# Patient Record
Sex: Female | Born: 1980 | Hispanic: Yes | Marital: Single | Smoking: Never smoker
Health system: Southern US, Community
[De-identification: ages and names within clinical notes are randomized; demographics above are authoritative.]

## PROBLEM LIST (undated history)

## (undated) ENCOUNTER — Inpatient Hospital Stay (HOSPITAL_COMMUNITY): Payer: Self-pay

## (undated) DIAGNOSIS — Z833 Family history of diabetes mellitus: Secondary | ICD-10-CM

## (undated) DIAGNOSIS — Z789 Other specified health status: Secondary | ICD-10-CM

## (undated) DIAGNOSIS — R0789 Other chest pain: Secondary | ICD-10-CM

## (undated) DIAGNOSIS — R079 Chest pain, unspecified: Secondary | ICD-10-CM

## (undated) HISTORY — DX: Other chest pain: R07.89

## (undated) HISTORY — DX: Chest pain, unspecified: R07.9

## (undated) HISTORY — DX: Family history of diabetes mellitus: Z83.3

---

## 2014-11-01 ENCOUNTER — Ambulatory Visit (INDEPENDENT_AMBULATORY_CARE_PROVIDER_SITE_OTHER): Payer: Self-pay | Admitting: Internal Medicine

## 2014-11-01 VITALS — BP 118/80 | HR 82 | Temp 98.1°F | Resp 18 | Ht 63.5 in | Wt 135.2 lb

## 2014-11-01 DIAGNOSIS — Z3490 Encounter for supervision of normal pregnancy, unspecified, unspecified trimester: Secondary | ICD-10-CM

## 2014-11-01 DIAGNOSIS — M542 Cervicalgia: Secondary | ICD-10-CM

## 2014-11-01 DIAGNOSIS — S161XXA Strain of muscle, fascia and tendon at neck level, initial encounter: Secondary | ICD-10-CM

## 2014-11-01 NOTE — Progress Notes (Signed)
   Subjective:    Patient ID: Melanie Haynes, female    DOB: 24-May-1981, 33 y.o.   MRN: [id number]  HPI 33 year old Hispanic Pregnant female is here today with complaints of neck pain that is radiating to the front of her chest for the past weeks. She states sha hast not done anything out of ordinary routine. She states that she does have a history of muscle spasms but can not remember the medication that she was previously prescribed. She states that she does pick up her 561 year old son a lot. History of a previous fall in 2002 while at work. She states she was carrying a tray and slipped the kitchen. She states that she was advised of a bruised hip that would resolve on its own. Patient confirmed pregnancy on Monday, 10/28/2014. She has a OBGYN appointment scheduled for December 3 rd [facility]. She states she also has a 32 year old daughter at home. She states she had a miscarriage on 07/30/2014, but had no complications from the miscarriage. She has had two cesareans stating that the only complications were bleeding in the first month of the 1st trimester.She states she is otherwise a healthy individual.      Review of Systems     Objective:   Physical Exam  Constitutional: She is oriented to person, place, and time. She appears well-developed and well-nourished. She appears distressed.  HENT:  Head: Normocephalic.  Nose: Nose normal.  Eyes: Conjunctivae and EOM are normal. Pupils are equal, round, and reactive to light.  Cardiovascular: Normal rate.   Pulmonary/Chest: Effort normal.  Musculoskeletal:       Cervical back: She exhibits decreased range of motion, tenderness, pain and spasm. She exhibits no bony tenderness, no swelling, no edema, no deformity and normal pulse.  Lymphadenopathy:    She has no cervical adenopathy.  Neurological: She is alert and oriented to person, place, and time. She has normal reflexes. No cranial nerve deficit. She exhibits normal muscle  tone. Coordination normal.  Psychiatric: She has a normal mood and affect. Her behavior is normal.  Vitals reviewed.         Assessment & Plan:  Neck strain Pregnancy

## 2014-11-01 NOTE — Patient Instructions (Signed)
Medicines During Pregnancy During pregnancy, there are medicines that are either safe or unsafe to take. Medicines include prescriptions from your caregiver, over-the-counter medicines, topical creams applied to the skin, and all herbal substances. Medicines are put into either Class A, B, C, or D. Class A and B medicines have been shown to be safe in pregnancy. Class C medicines are also considered to be safe in pregnancy, but these medicines should only be used when necessary. Class D medicines should not be used at all in pregnancy. They can be harmful to a baby.  It is best to take as little medicine as possible while pregnant. However, some medicines are necessary to take for the mother and baby's health. Sometimes, it is more dangerous to stop taking certain medicines than to stay on them. This is often the case for people with long-term (chronic) conditions such as asthma, diabetes, or high blood pressure (hypertension). If you are pregnant and have a chronic illness, call your caregiver right away. Bring a list of your medicines and their doses to your appointments. If you are planning to become pregnant, schedule a doctor's appointment and discuss your medicines with your caregiver. Lastly, write down the phone number to your pharmacist. They can answer questions regarding a medicine's class and safety. They cannot give advice as to whether you should or should not be on a medicine.  SAFE AND UNSAFE MEDICINES There is a long list of medicines that are considered safe in pregnancy. Below is a shorter list. For specific medicines, ask your caregiver.  AllergyMedicines Loratadine, cetirizine, and chlorpheniramine are safe to take. Certain nasal steroid sprays are safe. Talk to your caregiver about specific brands that are safe. Analgesics Acetaminophen and acetaminophen with codeine are safe to take. All other nonsteroidal anti-inflammatory drugs (NSAIDS) are not safe. This includes ibuprofen.   Antacids Many over-the-counter antacids are safe to take. Talk to your caregiver about specific brands that are safe. Famotidine, ranitidine, and lansoprazole are safe. Omepresole is considered safe to take in the second trimester. Antibiotic Medicines There are several antibiotics to avoid. These include, but are not limited to, tetracyline, quinolones, and sulfa medications. Talk to your caregiver before taking any antibiotic.  Antihistamines Talk to your caregiver about specific brands that are safe.  Asthma Medicines Most asthma steroid inhalers are safe to take. Talk to your caregiver for specific details. Calcium Calcium supplements are safe to take. Do not take oyster shell calcium.  Cough and Cold Medicines It is safe to take products with guaifenesin or dextromethorphan. Talk to your caregiver about specific brands that are safe. It is not safe to take products that contain aspirin or ibuprofen. Decongestant Medicines Pseudoephedrine-containing products are safe to take in the second and third trimester.  Depression Medicines Talk about these medicines with your caregiver.  Antidiarrheal Medicines It is safe to take loperamide. Talk to your caregiver about specific brands that are safe. It is not safe to take any antidiarrheal medicine that contains bismuth. Eyedrops Allergy eyedrops should be limited.  Iron It is safe to use certain iron-containing medicines for anemia in pregnancy. They require a prescription.  Antinausea Medicines It is safe to take doxylamine and vitamin B6 as directed. There are other prescription medicines available, if needed.  Sleep aids It is safe to take diphenhydramine and acetaminophen with diphenhydramine.  Steroids Hydrocortisone creams are safe to use as directed. Oral steroids require a prescription. It is not safe to take any hemorrhoid cream with pramoxine or phenylephrine. Stool  softener It is safe to take stool softener  medicines. Avoid daily or prolonged use of stool softeners. Thyroid Medicine It is important to stay on this thyroid medicine. It needs to be followed by your caregiver.  Vaginal Medicines Your caregiver will prescribe a medicine to you if you have a vaginal infection. Certain antifungal medicines are safe to use if you have a sexually transmitted infection (STI). Talk to your caregiver.  Document Released: 11/29/2005 Document Revised: 02/21/2012 Document Reviewed: 11/30/2011 Exit[employer] Patient Information 2015 [employer]. This information is not intended to replace advice given to you by your health care provider. Make sure you discuss any questions you have with your health care provider.  

## 2014-11-09 ENCOUNTER — Encounter (HOSPITAL_COMMUNITY): Payer: Self-pay | Admitting: *Deleted

## 2014-11-09 ENCOUNTER — Emergency Department (HOSPITAL_COMMUNITY): Payer: Medicaid Other

## 2014-11-09 ENCOUNTER — Emergency Department (HOSPITAL_COMMUNITY)
Admission: EM | Admit: 2014-11-09 | Discharge: 2014-11-10 | Disposition: A | Discharge status: Home / Self Care | Payer: Medicaid Other | Attending: Emergency Medicine | Admitting: Emergency Medicine

## 2014-11-09 DIAGNOSIS — O209 Hemorrhage in early pregnancy, unspecified: Secondary | ICD-10-CM | POA: Diagnosis present

## 2014-11-09 DIAGNOSIS — Z3A01 Less than 8 weeks gestation of pregnancy: Secondary | ICD-10-CM | POA: Diagnosis not present

## 2014-11-09 DIAGNOSIS — Z9889 Other specified postprocedural states: Secondary | ICD-10-CM | POA: Diagnosis not present

## 2014-11-09 DIAGNOSIS — O200 Threatened abortion: Secondary | ICD-10-CM | POA: Diagnosis not present

## 2014-11-09 DIAGNOSIS — N939 Abnormal uterine and vaginal bleeding, unspecified: Secondary | ICD-10-CM

## 2014-11-09 LAB — ABO/RH: ABO/RH(D): O POS

## 2014-11-09 LAB — CBC
HEMATOCRIT: 36.1 % (ref 36.0–46.0)
Hemoglobin: 12.1 g/dL (ref 12.0–15.0)
MCH: 27.9 pg (ref 26.0–34.0)
MCHC: 33.5 g/dL (ref 30.0–36.0)
MCV: 83.2 fL (ref 78.0–100.0)
Platelets: 263 10*3/uL (ref 150–400)
RBC: 4.34 MIL/uL (ref 3.87–5.11)
RDW: 14.9 % (ref 11.5–15.5)
WBC: 9.1 10*3/uL (ref 4.0–10.5)

## 2014-11-09 LAB — COMPREHENSIVE METABOLIC PANEL
ALT: 10 U/L (ref 0–35)
ANION GAP: 13 (ref 5–15)
AST: 14 U/L (ref 0–37)
Albumin: 3.7 g/dL (ref 3.5–5.2)
Alkaline Phosphatase: 44 U/L (ref 39–117)
BUN: 11 mg/dL (ref 6–23)
CHLORIDE: 102 meq/L (ref 96–112)
CO2: 22 meq/L (ref 19–32)
CREATININE: 0.58 mg/dL (ref 0.50–1.10)
Calcium: 9.7 mg/dL (ref 8.4–10.5)
GFR calc Af Amer: >90 mL/min (ref >90)
Glucose, Bld: 91 mg/dL (ref 70–99)
Potassium: 4.3 mEq/L (ref 3.7–5.3)
Sodium: 137 mEq/L (ref 137–147)
Total Protein: 7.1 g/dL (ref 6.0–8.3)

## 2014-11-09 LAB — PREGNANCY, URINE: Preg Test, Ur: POSITIVE — AB

## 2014-11-09 LAB — HCG, QUANTITATIVE, PREGNANCY: hCG, Beta Chain, Quant, S: 23613 m[IU]/mL — ABNORMAL HIGH (ref <5)

## 2014-11-09 MED ORDER — PRENATAL MULTIVITAMIN CH: 1.0000 | ORAL_TABLET | Freq: Every day | ORAL | Status: AC

## 2014-11-09 NOTE — ED Notes (Signed)
Pt in c/o lower back pain since yesterday and today noted vaginal bleeding that she describes as spotting, pt reports that she is approx [redacted] weeks pregnant, has not had an ultrasound completed, no distress noted

## 2014-11-09 NOTE — ED Notes (Signed)
Denies N/V/D

## 2014-11-09 NOTE — ED Provider Notes (Signed)
CSN: [id number]     Arrival date & time 11/09/14  1802 History   First MD Initiated Contact with Patient 11/09/14 2226     Chief Complaint  Patient presents with  . Vaginal Bleeding  . Pregnant      (Consider location/radiation/quality/duration/timing/severity/associated sxs/prior Treatment) Patient is a 33 y.o. female presenting with vaginal bleeding. The history is provided by the patient.  Vaginal Bleeding Quality:  Bright red Severity:  Mild Onset quality:  Gradual Duration:  1 day Timing:  Constant Progression:  Worsening Chronicity:  New Possible pregnancy: yes   Context: not after intercourse and not during urination   Relieved by:  None tried Worsened by:  Nothing tried Ineffective treatments:  None tried Associated symptoms: abdominal pain and back pain   Associated symptoms: no dizziness, no dysuria and no fatigue   Risk factors: prior miscarriage   Risk factors: no hx of ectopic pregnancy and no gynecological surgery     History reviewed. No pertinent past medical history. Past Surgical History  Procedure Laterality Date  . Cesarean section     History reviewed. No pertinent family history. History  Substance Use Topics  . Smoking status: Never Smoker   . Smokeless tobacco: Not on file  . Alcohol Use: No   OB History    No data available     Review of Systems  Constitutional: Negative for fatigue.  Gastrointestinal: Positive for abdominal pain.  Genitourinary: Positive for vaginal bleeding. Negative for dysuria.  Musculoskeletal: Positive for back pain.  Neurological: Negative for dizziness and syncope.  All other systems reviewed and are negative.     Allergies  Review of patient's allergies indicates no known allergies.  Home Medications   Prior to Admission medications   Medication Sig Start Date End Date Taking? Authorizing Provider  Prenatal Vit-Fe Fumarate-FA (PRENATAL MULTIVITAMIN) TABS tablet Take 1 tablet by mouth daily at 12 noon.  11/09/14   Marily MemosJason Beva Remund, MD   BP 117/75 mmHg  Pulse 96  Temp(Src) 98.2 F (36.8 C) (Oral)  Resp 16  Ht 5\' 3"  (1.6 m)  Wt 135 lb 9.6 oz (61.508 kg)  BMI 24.03 kg/m2  SpO2 100%  LMP 09/30/2014 Physical Exam  Constitutional: She is oriented to person, place, and time. She appears well-developed and well-nourished.  HENT:  Head: Normocephalic and atraumatic.  Eyes: Conjunctivae and EOM are normal. Pupils are equal, round, and reactive to light.  Neck: Normal range of motion.  Cardiovascular: Normal rate and regular rhythm.   Pulmonary/Chest: Effort normal and breath sounds normal.  Abdominal: Soft. Bowel sounds are normal.  Musculoskeletal: Normal range of motion. She exhibits no edema or tenderness.  Neurological: She is alert and oriented to person, place, and time.  Nursing note and vitals reviewed.   ED Course  Procedures (including critical care time) Labs Review Labs Reviewed  HCG, QUANTITATIVE, PREGNANCY - Abnormal; Notable for the following:    hCG, Beta Chain, [place] LongestQuant, S [id number] (*)    All other components within normal limits  PREGNANCY, URINE - Abnormal; Notable for the following:    Preg Test, Ur POSITIVE (*)    All other components within normal limits  COMPREHENSIVE METABOLIC PANEL - Abnormal; Notable for the following:    Total Bilirubin <0.2 (*)    All other components within normal limits  CBC  ABO/RH    Imaging Review [place] Ob Comp Less 14 Wks  11/09/2014   CLINICAL DATA:  Acute onset of lower back pain and vaginal bleeding. Initial  encounter.  EXAM: OBSTETRIC <14 WK US AND TRANSVAGINAL OB US  TECHNIQUE: Both transabdominal and transvaginal ultrasound examinations were performed for complete evaluation of the gestation as well as the maternal uterus, adnexal regions, and pelvic cul-de-sac. Transvaginal technique was performed to assess early pregnancy.  COMPARISON:  None.  FINDINGS: Intrauterine gestational sac: Visualized/normal in shape.  Yolk sac:  Yes  Embryo:   No  Cardiac Activity: N/A  MSD: 1.7 cm   6 w   4  d  Maternal uterus/adnexae: No subchorionic hemorrhage is noted. The uterus is otherwise unremarkable in appearance.  The right ovary is unremarkable in appearance. It measures 4.1 x 2.7 x 3.0 cm. The left ovary is not visualized on this study. No suspicious adnexal masses are seen; there is no evidence for ovarian torsion.  No free fluid is seen within the pelvic cul-de-sac.  IMPRESSION: Single intrauterine gestational sac noted, with a mean sac diameter of 1.7 cm, corresponding to a gestational age of [redacted] weeks 4 days. This matches the gestational age of [redacted] weeks 5 days by LMP, reflecting an estimated date of delivery of July 25th, 2016. No embryo is yet seen, though a yolk sac is visualized. This remains within normal limits.   Electronically Signed   By: Roanna RaiderJeffery  Chang M.D.   On: 11/09/2014 23:06   [place] Ob Transvaginal  11/09/2014   CLINICAL DATA:  Acute onset of lower back pain and vaginal bleeding. Initial encounter.  EXAM: OBSTETRIC <14 WK US AND TRANSVAGINAL OB US  TECHNIQUE: Both transabdominal and transvaginal ultrasound examinations were performed for complete evaluation of the gestation as well as the maternal uterus, adnexal regions, and pelvic cul-de-sac. Transvaginal technique was performed to assess early pregnancy.  COMPARISON:  None.  FINDINGS: Intrauterine gestational sac: Visualized/normal in shape.  Yolk sac:  Yes  Embryo:  No  Cardiac Activity: N/A  MSD: 1.7 cm   6 w   4  d  Maternal uterus/adnexae: No subchorionic hemorrhage is noted. The uterus is otherwise unremarkable in appearance.  The right ovary is unremarkable in appearance. It measures 4.1 x 2.7 x 3.0 cm. The left ovary is not visualized on this study. No suspicious adnexal masses are seen; there is no evidence for ovarian torsion.  No free fluid is seen within the pelvic cul-de-sac.  IMPRESSION: Single intrauterine gestational sac noted, with a mean sac diameter of 1.7 cm,  corresponding to a gestational age of [redacted] weeks 4 days. This matches the gestational age of [redacted] weeks 5 days by LMP, reflecting an estimated date of delivery of July 25th, 2016. No embryo is yet seen, though a yolk sac is visualized. This remains within normal limits.   Electronically Signed   By: Roanna RaiderJeffery  Chang M.D.   On: 11/09/2014 23:06     EKG Interpretation None      MDM   Final diagnoses:  Threatened miscarriage    33 yo F G4P2012 at approx 5 weeks ehre with vaginal bleeding and back pain, abdominal cramping similar to previous miscarriage. No syncope or other evidence of acute blood loss anemia. Likely threatened v incomplete abortion. Will get labs and US to assess fetal position.   US with yolk sac appropriate place. Hemoglobin appropriate. stable in ED. likely threatened abortion. Will give FU info with obstetrics and bleeding precautions.   Marily MemosJason Kody Vigil, MD 11/10/14 [id number]  Derwood KaplanAnkit Nanavati, MD 11/10/14 [id number]

## 2014-11-10 NOTE — ED Notes (Signed)
Pt discharged home with all belongings, pt alert, oriented and ambulatory upon discharge. Pt drove self home, no narcotics given in ED by this RN, pt verbalizes understanding of discharge instructions

## 2014-11-14 LAB — OB RESULTS CONSOLE ABO/RH: RH Type: POSITIVE

## 2014-11-14 LAB — OB RESULTS CONSOLE RUBELLA ANTIBODY, IGM: Rubella: IMMUNE

## 2014-11-14 LAB — OB RESULTS CONSOLE HIV ANTIBODY (ROUTINE TESTING): HIV: NONREACTIVE

## 2014-11-14 LAB — OB RESULTS CONSOLE ANTIBODY SCREEN: Antibody Screen: NEGATIVE

## 2014-11-14 LAB — OB RESULTS CONSOLE GC/CHLAMYDIA
CHLAMYDIA, DNA PROBE: NEGATIVE
GC PROBE AMP, GENITAL: NEGATIVE

## 2014-12-03 ENCOUNTER — Other Ambulatory Visit (HOSPITAL_COMMUNITY): Payer: Self-pay | Admitting: Obstetrics and Gynecology

## 2014-12-03 ENCOUNTER — Ambulatory Visit (HOSPITAL_COMMUNITY)
Admission: RE | Admit: 2014-12-03 | Discharge: 2014-12-03 | Disposition: A | Discharge status: Home / Self Care | Payer: Medicaid Other | Source: Ambulatory Visit | Attending: Obstetrics and Gynecology | Admitting: Obstetrics and Gynecology

## 2014-12-03 DIAGNOSIS — O3680X1 Pregnancy with inconclusive fetal viability, fetus 1: Secondary | ICD-10-CM | POA: Insufficient documentation

## 2015-03-27 ENCOUNTER — Other Ambulatory Visit: Payer: Self-pay | Admitting: Obstetrics and Gynecology

## 2015-04-24 ENCOUNTER — Encounter (HOSPITAL_COMMUNITY): Payer: Self-pay | Admitting: *Deleted

## 2015-04-24 ENCOUNTER — Inpatient Hospital Stay (HOSPITAL_COMMUNITY)
Admission: AD | Admit: 2015-04-24 | Discharge: 2015-04-24 | Disposition: A | Discharge status: Home / Self Care | Payer: Medicaid Other | Source: Ambulatory Visit | Attending: Obstetrics and Gynecology | Admitting: Obstetrics and Gynecology

## 2015-04-24 DIAGNOSIS — O9989 Other specified diseases and conditions complicating pregnancy, childbirth and the puerperium: Secondary | ICD-10-CM | POA: Diagnosis not present

## 2015-04-24 DIAGNOSIS — B9689 Other specified bacterial agents as the cause of diseases classified elsewhere: Secondary | ICD-10-CM

## 2015-04-24 DIAGNOSIS — Z3A30 30 weeks gestation of pregnancy: Secondary | ICD-10-CM | POA: Insufficient documentation

## 2015-04-24 DIAGNOSIS — N760 Acute vaginitis: Secondary | ICD-10-CM

## 2015-04-24 DIAGNOSIS — R109 Unspecified abdominal pain: Secondary | ICD-10-CM | POA: Insufficient documentation

## 2015-04-24 HISTORY — DX: Other specified health status: Z78.9

## 2015-04-24 LAB — URINE MICROSCOPIC-ADD ON

## 2015-04-24 LAB — URINALYSIS, ROUTINE W REFLEX MICROSCOPIC
BILIRUBIN URINE: NEGATIVE
GLUCOSE, UA: NEGATIVE mg/dL
Hgb urine dipstick: NEGATIVE
KETONES UR: NEGATIVE mg/dL
Nitrite: NEGATIVE
PH: 5.5 (ref 5.0–8.0)
Protein, ur: NEGATIVE mg/dL
Specific Gravity, Urine: <1.005 — ABNORMAL LOW (ref 1.005–1.030)
Urobilinogen, UA: 0.2 mg/dL (ref 0.0–1.0)

## 2015-04-24 LAB — GC/CHLAMYDIA PROBE AMP (~~LOC~~) NOT AT ARMC
CHLAMYDIA, DNA PROBE: NEGATIVE
Neisseria Gonorrhea: NEGATIVE

## 2015-04-24 LAB — WET PREP, GENITAL
Trich, Wet Prep: NONE SEEN
Yeast Wet Prep HPF POC: NONE SEEN

## 2015-04-24 MED ORDER — METRONIDAZOLE 500 MG PO TABS: 500.0000 mg | ORAL_TABLET | Freq: Two times a day (BID) | ORAL | Status: DC

## 2015-04-24 NOTE — MAU Provider Note (Signed)
History    Melanie Haynes is a 33y.o. [id number] at 30.3wks who presents, unannounced, for abdominal tightening.  Patient reports symptoms started at 1030pm and have been about every 5 minutes.  Patient reports good fetal movement, while denying VB, LOF, and change in vaginal discharge.  Patient reports adequate hydration and denies issues with urination or bowel movements.    There are no active problems to display for this patient.   No chief complaint on file.  HPI  OB History    Gravida Para Term Preterm AB TAB SAB Ectopic Multiple Living   4 2 2  1  1   2       Past Medical History  Diagnosis Date  . Medical history non-contributory     Past Surgical History  Procedure Laterality Date  . Cesarean section      History reviewed. No pertinent family history.  History  Substance Use Topics  . Smoking status: Never Smoker   . Smokeless tobacco: Not on file  . Alcohol Use: No    Allergies: No Known Allergies  Prescriptions prior to admission  Medication Sig Dispense Refill Last Dose  . Prenatal Vit-Fe Fumarate-FA (PRENATAL MULTIVITAMIN) TABS tablet Take 1 tablet by mouth daily at 12 noon. 30 tablet 7 04/23/2015 at Unknown time    ROS  See HPI Above Physical Exam   Blood pressure 111/82, pulse 91, temperature 97.9 F (36.6 C), temperature source Oral, resp. rate 16, height 5\' 4"  (1.626 m), weight 73.029 kg (161 lb), last menstrual period 09/30/2014, SpO2 100 %.  Results for orders placed or performed during the hospital encounter of 04/24/15 (from the past 24 hour(s))  Urinalysis, Routine w reflex microscopic     Status: Abnormal   Collection Time: 04/24/15 12:20 AM  Result Value Ref Range   Color, Urine YELLOW YELLOW   APPearance CLEAR CLEAR   Specific Gravity, Urine <1.005 (L) 1.005 - 1.030   pH 5.5 5.0 - 8.0   Glucose, UA NEGATIVE NEGATIVE mg/dL   Hgb urine dipstick NEGATIVE NEGATIVE   Bilirubin Urine NEGATIVE NEGATIVE   Ketones, ur NEGATIVE NEGATIVE mg/dL   Protein, ur NEGATIVE NEGATIVE mg/dL   Urobilinogen, UA 0.2 0.0 - 1.0 mg/dL   Nitrite NEGATIVE NEGATIVE   Leukocytes, UA MODERATE (A) NEGATIVE  Urine microscopic-add on     Status: Abnormal   Collection Time: 04/24/15 12:20 AM  Result Value Ref Range   Squamous Epithelial / LPF FEW (A) RARE   WBC, UA 11-20 <3 WBC/hpf   RBC / HPF 3-6 <3 RBC/hpf   Bacteria, UA FEW (A) RARE  Wet prep, genital     Status: Abnormal   Collection Time: 04/24/15  1:20 AM  Result Value Ref Range   Yeast Wet Prep HPF POC NONE SEEN NONE SEEN   Trich, Wet Prep NONE SEEN NONE SEEN   Clue Cells Wet Prep HPF POC FEW (A) NONE SEEN   WBC, Wet Prep HPF POC MODERATE (A) NONE SEEN    Physical Exam  Constitutional: She is oriented to person, place, and time. She appears well-developed and well-nourished. No distress.  HENT:  Head: Normocephalic and atraumatic.  Eyes: EOM are normal.  Neck: Normal range of motion.  Cardiovascular: Normal rate, regular rhythm and normal heart sounds.   Respiratory: Effort normal and breath sounds normal.  GI: Soft. Bowel sounds are normal.  Genitourinary: Uterus is enlarged (Gravid). Cervix exhibits discharge. Vaginal discharge found.  Sterile Speculum Exam: -Introitus: Thin white discharge noted -Vaginal Vault:  Thin white discharge noted-wet prep collected -Cervix: Thick white discharge from os-GC/CT collected Bimanual Exam: Closed/Long/Thick/Ballotable  Musculoskeletal: Normal range of motion.  Neurological: She is alert and oriented to person, place, and time.  Skin: Skin is warm and dry.  Psychiatric: She has a normal mood and affect.    FHR: 145 bpm, Mod Var, -Decels, +Accels UC: None graphed, irritability noted ED Course  Assessment: IUP at 30.3wks Cat I FT Abdominal Tightening  Plan: -PE as above -Labs; Wet prep, UA, Gc/Ct -Await results  Follow Up (0225) -UA sent for culture -Wet prep positive for BV -RX for flagyl 500mg  BID x 7 days Disp 14, RF 0 -Patient  educated on findings, questions and concerns addressed -Bleeding and PTL Precautions -Keep appt as scheduled -Encouraged to call if any questions or concerns arise prior to next scheduled office visit.  -Discharged to home in stable condition  Danecia Underdown LYNN CNM, MSN 04/24/2015 12:59 AM

## 2015-04-24 NOTE — Discharge Instructions (Signed)
Bacterial Vaginosis Bacterial vaginosis is a vaginal infection that occurs when the normal balance of bacteria in the vagina is disrupted. It results from an overgrowth of certain bacteria. This is the most common vaginal infection in women of childbearing age. Treatment is important to prevent complications, especially in pregnant women, as it can cause a premature delivery. CAUSES  Bacterial vaginosis is caused by an increase in harmful bacteria that are normally present in smaller amounts in the vagina. Several different kinds of bacteria can cause bacterial vaginosis. However, the reason that the condition develops is not fully understood. RISK FACTORS Certain activities or behaviors can put you at an increased risk of developing bacterial vaginosis, including:  Having a new sex partner or multiple sex partners.  Douching.  Using an intrauterine device (IUD) for contraception. Women do not get bacterial vaginosis from toilet seats, bedding, swimming pools, or contact with objects around them. SIGNS AND SYMPTOMS  Some women with bacterial vaginosis have no signs or symptoms. Common symptoms include:  Grey vaginal discharge.  A fishlike odor with discharge, especially after sexual intercourse.  Itching or burning of the vagina and vulva.  Burning or pain with urination. DIAGNOSIS  Your health care provider will take a medical history and examine the vagina for signs of bacterial vaginosis. A sample of vaginal fluid may be taken. Your health care provider will look at this sample under a microscope to check for bacteria and abnormal cells. A vaginal pH test may also be done.  TREATMENT  Bacterial vaginosis may be treated with antibiotic medicines. These may be given in the form of a pill or a vaginal cream. A second round of antibiotics may be prescribed if the condition comes back after treatment.  HOME CARE INSTRUCTIONS   Only take over-the-counter or prescription medicines as  directed by your health care provider.  If antibiotic medicine was prescribed, take it as directed. Make sure you finish it even if you start to feel better.  Do not have sex until treatment is completed.  Tell all sexual partners that you have a vaginal infection. They should see their health care provider and be treated if they have problems, such as a mild rash or itching.  Practice safe sex by using condoms and only having one sex partner. SEEK MEDICAL CARE IF:   Your symptoms are not improving after 3 days of treatment.  You have increased discharge or pain.  You have a fever. MAKE SURE YOU:   Understand these instructions.  Will watch your condition.  Will get help right away if you are not doing well or get worse. FOR MORE INFORMATION  Centers for Disease Control and Prevention, Division of STD Prevention: SolutionApps.co.zawww.cdc.gov/std American Sexual Health Association (ASHA): www.ashastd.org  Document Released: 11/29/2005 Document Revised: 09/19/2013 Document Reviewed: 07/11/2013 [facility] Patient Information 2015 [place], [place]. This information is not intended to replace advice given to you by your health care provider. Make sure you discuss any questions you have with your health care provider. Third Trimester of Pregnancy The third trimester is from week 29 through week 42, months 7 through 9. The third trimester is a time when the fetus is growing rapidly. At the end of the ninth month, the fetus is about 20 inches in length and weighs 6-10 pounds.  BODY CHANGES Your body goes through many changes during pregnancy. The changes vary from woman to woman.   Your weight will continue to increase. You can expect to gain 25-35 pounds (11-16 kg) by  the end of the pregnancy.  You may begin to get stretch marks on your hips, abdomen, and breasts.  You may urinate more often because the fetus is moving lower into your pelvis and pressing on your bladder.  You may develop or continue to  have heartburn as a result of your pregnancy.  You may develop constipation because certain hormones are causing the muscles that push waste through your intestines to slow down.  You may develop hemorrhoids or swollen, bulging veins (varicose veins).  You may have pelvic pain because of the weight gain and pregnancy hormones relaxing your joints between the bones in your pelvis. Backaches may result from overexertion of the muscles supporting your posture.  You may have changes in your hair. These can include thickening of your hair, rapid growth, and changes in texture. Some women also have hair loss during or after pregnancy, or hair that feels dry or thin. Your hair will most likely return to normal after your baby is born.  Your breasts will continue to grow and be tender. A yellow discharge may leak from your breasts called colostrum.  Your belly button may stick out.  You may feel short of breath because of your expanding uterus.  You may notice the fetus "dropping," or moving lower in your abdomen.  You may have a bloody mucus discharge. This usually occurs a few days to a week before labor begins.  Your cervix becomes thin and soft (effaced) near your due date. WHAT TO EXPECT AT YOUR PRENATAL EXAMS  You will have prenatal exams every 2 weeks until week 36. Then, you will have weekly prenatal exams. During a routine prenatal visit:  You will be weighed to make sure you and the fetus are growing normally.  Your blood pressure is taken.  Your abdomen will be measured to track your baby's growth.  The fetal heartbeat will be listened to.  Any test results from the previous visit will be discussed.  You may have a cervical check near your due date to see if you have effaced. At around 36 weeks, your caregiver will check your cervix. At the same time, your caregiver will also perform a test on the secretions of the vaginal tissue. This test is to determine if a type of bacteria,  Group B streptococcus, is present. Your caregiver will explain this further. Your caregiver may ask you:  What your birth plan is.  How you are feeling.  If you are feeling the baby move.  If you have had any abnormal symptoms, such as leaking fluid, bleeding, severe headaches, or abdominal cramping.  If you have any questions. Other tests or screenings that may be performed during your third trimester include:  Blood tests that check for low iron levels (anemia).  Fetal testing to check the health, activity level, and growth of the fetus. Testing is done if you have certain medical conditions or if there are problems during the pregnancy. FALSE LABOR You may feel small, irregular contractions that eventually go away. These are called Braxton Hicks contractions, or false labor. Contractions may last for hours, days, or even weeks before true labor sets in. If contractions come at regular intervals, intensify, or become painful, it is best to be seen by your caregiver.  SIGNS OF LABOR   Menstrual-like cramps.  Contractions that are 5 minutes apart or less.  Contractions that start on the top of the uterus and spread down to the lower abdomen and back.  A sense  of increased pelvic pressure or back pain.  A watery or bloody mucus discharge that comes from the vagina. If you have any of these signs before the 37th week of pregnancy, call your caregiver right away. You need to go to the hospital to get checked immediately. HOME CARE INSTRUCTIONS   Avoid all smoking, herbs, alcohol, and unprescribed drugs. These chemicals affect the formation and growth of the baby.  Follow your caregiver's instructions regarding medicine use. There are medicines that are either safe or unsafe to take during pregnancy.  Exercise only as directed by your caregiver. Experiencing uterine cramps is a good sign to stop exercising.  Continue to eat regular, healthy meals.  Wear a good support bra for  breast tenderness.  Do not use hot tubs, steam rooms, or saunas.  Wear your seat belt at all times when driving.  Avoid raw meat, uncooked cheese, cat litter boxes, and soil used by cats. These carry germs that can cause birth defects in the baby.  Take your prenatal vitamins.  Try taking a stool softener (if your caregiver approves) if you develop constipation. Eat more high-fiber foods, such as fresh vegetables or fruit and whole grains. Drink plenty of fluids to keep your urine clear or pale yellow.  Take warm sitz baths to soothe any pain or discomfort caused by hemorrhoids. Use hemorrhoid cream if your caregiver approves.  If you develop varicose veins, wear support hose. Elevate your feet for 15 minutes, 3-4 times a day. Limit salt in your diet.  Avoid heavy lifting, wear low heal shoes, and practice good posture.  Rest a lot with your legs elevated if you have leg cramps or low back pain.  Visit your dentist if you have not gone during your pregnancy. Use a soft toothbrush to brush your teeth and be gentle when you floss.  A sexual relationship may be continued unless your caregiver directs you otherwise.  Do not travel far distances unless it is absolutely necessary and only with the approval of your caregiver.  Take prenatal classes to understand, practice, and ask questions about the labor and delivery.  Make a trial run to the hospital.  Pack your hospital bag.  Prepare the baby's nursery.  Continue to go to all your prenatal visits as directed by your caregiver. SEEK MEDICAL CARE IF:  You are unsure if you are in labor or if your water has broken.  You have dizziness.  You have mild pelvic cramps, pelvic pressure, or nagging pain in your abdominal area.  You have persistent nausea, vomiting, or diarrhea.  You have a bad smelling vaginal discharge.  You have pain with urination. SEEK IMMEDIATE MEDICAL CARE IF:   You have a fever.  You are leaking fluid  from your vagina.  You have spotting or bleeding from your vagina.  You have severe abdominal cramping or pain.  You have rapid weight loss or gain.  You have shortness of breath with chest pain.  You notice sudden or extreme swelling of your face, hands, ankles, feet, or legs.  You have not felt your baby move in over an hour.  You have severe headaches that do not go away with medicine.  You have vision changes. Document Released: 11/23/2001 Document Revised: 12/04/2013 Document Reviewed: 01/30/2013 [facility] Patient Information 2015 [place]Care, [place]LL[place]. This information is not intended to replace advice given to you by your health care provider. Make sure you discuss any questions you have with your health care provider.

## 2015-04-24 NOTE — MAU Note (Signed)
Pt reports her stomach has been getting really tight off/on for the last hour. Denies bleeding or ROM

## 2015-04-25 LAB — CULTURE, OB URINE
Colony Count: NO GROWTH
Culture: NO GROWTH

## 2015-06-22 ENCOUNTER — Inpatient Hospital Stay (HOSPITAL_COMMUNITY)
Admission: AD | Admit: 2015-06-22 | Discharge: 2015-06-23 | Disposition: A | Discharge status: Home / Self Care | Payer: Medicaid Other | Source: Ambulatory Visit | Attending: Obstetrics and Gynecology | Admitting: Obstetrics and Gynecology

## 2015-06-22 ENCOUNTER — Encounter (HOSPITAL_COMMUNITY): Payer: Self-pay

## 2015-06-22 DIAGNOSIS — O3421 Maternal care for scar from previous cesarean delivery: Secondary | ICD-10-CM | POA: Diagnosis not present

## 2015-06-22 DIAGNOSIS — Z3A38 38 weeks gestation of pregnancy: Secondary | ICD-10-CM | POA: Insufficient documentation

## 2015-06-22 DIAGNOSIS — Z3483 Encounter for supervision of other normal pregnancy, third trimester: Secondary | ICD-10-CM

## 2015-06-22 MED ORDER — LACTATED RINGERS IV BOLUS (SEPSIS)
1000.0000 mL | Freq: Once | INTRAVENOUS | Status: AC
  Administered 2015-06-22: 1000 mL via INTRAVENOUS

## 2015-06-22 NOTE — MAU Note (Signed)
Pt here with c/o contractions all day.  Previous C/S x2, scheduled for another C/S next Monday 7/18. Denies any bleeding or leaking of fluid. Reports decreased fetal movement today.

## 2015-06-22 NOTE — MAU Provider Note (Signed)
Melanie Haynes is a 34 y.o. G4P2 at 38.1 weeks called earlier c/o non ctx like pain off and of since this morning.  She denies vb orlof w+fm.  She report have CS x2 with a repeat schedule for next Monday.     History     There are no active problems to display for this patient.   Chief Complaint  Patient presents with  . Contractions   HPI  OB History    Gravida Para Term Preterm AB TAB SAB Ectopic Multiple Living   4 2 2  1  1   2       Past Medical History  Diagnosis Date  . Medical history non-contributory     Past Surgical History  Procedure Laterality Date  . Cesarean section      No family history on file.  History  Substance Use Topics  . Smoking status: Never Smoker   . Smokeless tobacco: Not on file  . Alcohol Use: No    Allergies: No Known Allergies  Prescriptions prior to admission  Medication Sig Dispense Refill Last Dose  . Cholecalciferol (VITAMIN D) 2000 UNITS tablet Take 2,000 Units by mouth daily.     . Multiple Vitamin (MULTIVITAMIN WITH MINERALS) TABS tablet Take 1 tablet by mouth daily. Pt states that she takes a prenatal vitamin with a regular vitamin.     . Prenatal Vit-Fe Fumarate-FA (PRENATAL MULTIVITAMIN) TABS tablet Take 1 tablet by mouth daily at 12 noon. (Patient taking differently: Take 1 tablet by mouth daily at 12 noon. Pt states that she takes a prenatal vitamin with a regular vitamin) 30 tablet 7 04/23/2015 at Unknown time    ROS See HPI above, all other systems are negative  Physical Exam   Blood pressure 132/89, pulse 86, temperature 98.2 F (36.8 C), temperature source Oral, resp. rate 18, height 5\' 3"  (1.6 m), weight 171 lb (77.565 kg), last menstrual period 09/30/2014.  Physical Exam Ext:  WNL ABD: Soft, non tender to palpation, no rebound or guarding SVE: FT/T/H/P   ED Course  Assessment: IUP at  38.1weeks Membranes: intacgt FHR: Category 1 CTX:  8-9 minutes   Plan: Observe for 1 hour for  change IVF   Tore Carreker, CNM, MSN 06/22/2015. 11:33 PM  0245 VE unchanged, ctx con't.  Pt report feeling ctx but not painful

## 2015-06-23 LAB — CBC
HEMATOCRIT: 33.8 % — AB (ref 36.0–46.0)
Hemoglobin: 11.3 g/dL — ABNORMAL LOW (ref 12.0–15.0)
MCH: 27.3 pg (ref 26.0–34.0)
MCHC: 33.4 g/dL (ref 30.0–36.0)
MCV: 81.6 fL (ref 78.0–100.0)
PLATELETS: 242 10*3/uL (ref 150–400)
RBC: 4.14 MIL/uL (ref 3.87–5.11)
RDW: 15.2 % (ref 11.5–15.5)
WBC: 8.3 10*3/uL (ref 4.0–10.5)

## 2015-06-23 LAB — URINALYSIS, ROUTINE W REFLEX MICROSCOPIC
Bilirubin Urine: NEGATIVE
Glucose, UA: NEGATIVE mg/dL
KETONES UR: NEGATIVE mg/dL
Nitrite: NEGATIVE
PROTEIN: NEGATIVE mg/dL
SPECIFIC GRAVITY, URINE: 1.015 (ref 1.005–1.030)
Urobilinogen, UA: 0.2 mg/dL (ref 0.0–1.0)
pH: 5.5 (ref 5.0–8.0)

## 2015-06-23 LAB — URINE MICROSCOPIC-ADD ON

## 2015-06-23 LAB — RPR: RPR Ser Ql: NONREACTIVE

## 2015-06-23 MED ORDER — CEFAZOLIN SODIUM-DEXTROSE 2-3 GM-% IV SOLR: 2.0000 g | INTRAVENOUS | Status: DC

## 2015-06-23 MED ORDER — ACETAMINOPHEN 325 MG PO TABS
650.0000 mg | ORAL_TABLET | Freq: Once | ORAL | Status: AC
  Administered 2015-06-23: 650 mg via ORAL
  Filled 2015-06-23: qty 2

## 2015-06-23 MED ORDER — LACTATED RINGERS IV SOLN
INTRAVENOUS | Status: DC
  Administered 2015-06-23: 02:00:00 via INTRAVENOUS

## 2015-06-23 NOTE — Progress Notes (Addendum)
Addendum  Pt report the ctx have slowed down FHT 135, moderate variability, + accel, no decel, ctx occassional VE unchanged Dr Su Hiltoberts aware Potential to DC to home and wait for true labor to start   0620 Addendum MAU Addendum Note  Results for orders placed or performed during the hospital encounter of 06/22/15 (from the past 24 hour(s))  Urinalysis, Routine w reflex microscopic ([facility])     Status: Abnormal   Collection Time: 06/23/15  2:55 AM  Result Value Ref Range   Color, Urine YELLOW YELLOW   APPearance CLEAR CLEAR   Specific Gravity, Urine 1.015 1.005 - 1.030   pH 5.5 5.0 - 8.0   Glucose, UA NEGATIVE NEGATIVE mg/dL   Hgb urine dipstick LARGE (A) NEGATIVE   Bilirubin Urine NEGATIVE NEGATIVE   Ketones, ur NEGATIVE NEGATIVE mg/dL   Protein, ur NEGATIVE NEGATIVE mg/dL   Urobilinogen, UA 0.2 0.0 - 1.0 mg/dL   Nitrite NEGATIVE NEGATIVE   Leukocytes, UA SMALL (A) NEGATIVE  Urine microscopic-add on     Status: Abnormal   Collection Time: 06/23/15  2:55 AM  Result Value Ref Range   Squamous Epithelial / LPF RARE RARE   WBC, UA 3-6 <3 WBC/hpf   RBC / HPF 7-10 <3 RBC/hpf   Bacteria, UA FEW (A) RARE  CBC     Status: Abnormal   Collection Time: 06/23/15  3:55 AM  Result Value Ref Range   WBC 8.3 4.0 - 10.5 K/uL   RBC 4.14 3.87 - 5.11 MIL/uL   Hemoglobin 11.3 (L) 12.0 - 15.0 g/dL   HCT 40.933.8 (L) 81.136.0 - 91.446.0 %   MCV 81.6 78.0 - 100.0 fL   MCH 27.3 26.0 - 34.0 pg   MCHC 33.4 30.0 - 36.0 g/dL   RDW 78.215.2 95.611.5 - 21.315.5 %   Platelets 242 150 - 400 K/uL     Plan: -Discussed need to follow up in office  -Bleeding and PTL Precautions -Encouraged to call if any questions or concerns arise prior to next scheduled office visit.  -Discharged to home in stable condition per Dr. Sande Brothersoberts   Taksh Hjort, CNM, MSN 06/23/2015. 6:19 AM

## 2015-06-23 NOTE — H&P (Signed)
Melanie Haynes is a 34 y.o. female, G4 P2 at 38.1 weeks presented to MAU for ctx SP CS x2.    Pregnancy Course: Patient entered care at 14.4 weeks.   EDC of 07/05/15 was established by LMP.      US evaluations:   15.4 weeks - single. vertex. placenta appears right lateral. fluid is normal vertical pocket 3.6 cm. R ovary wnl. L ovary not visualized. Cx closed. Measured TV. No funneling seen.  19.3 weeks - Anatomy: EFW 10oz  - 38%, FHR 146, cervical length 3.62, vertex, posterior placenta, heart and RVOT not seen, female     23.4 weeks - FU: cervical length 4.75, EFW 1lb 5oz - 34.9%,    27.4 weeks -  FU: EFW 2lb 8oz - 52%, WFR 140, vertex, RVOT seen, 4ch seen, no funneling  30.4 weeks - FU: EFW 3lb 6oz - 27.9%, AFI 13.51, FHR 158, vertex,  Right lateral placenta.   Significant prenatal events:   Hx 22 week loss, refused 17P and a cerclage   Last evaluation:   37.4 weeks   VE:2/50/-2 on 06/18/15  Reason for admission:  Repeat CS  Pt States:   Contractions Frequency: 6-8         Contraction severity: mild         Fetal activity: +FM  OB History    Gravida Para Term Preterm AB TAB SAB Ectopic Multiple Living   4 2 2  1  1   2      Past Medical History  Diagnosis Date  . Medical history non-contributory    Past Surgical History  Procedure Laterality Date  . Cesarean section     Family History: family history is not on file. Social History:  reports that she has never smoked. She does not have any smokeless tobacco history on file. She reports that she does not drink alcohol or use illicit drugs.   Prenatal Transfer Tool  Maternal Diabetes: No Genetic Screening: Declined Maternal Ultrasounds/Referrals: Normal Fetal Ultrasounds or other Referrals:  None Maternal Substance Abuse:  No Significant Maternal Medications:  None Significant Maternal Lab Results: Lab values include: Group B Strep positive, Other: RNI   ROS:  See HPI above, all other systems are negative  No Known  Allergies  Dilation: Fingertip Effacement (%): Thick Station: -3 Exam by:: PharmacologistAmber Stovall RN Blood pressure 132/89, pulse 86, temperature 98.2 F (36.8 C), temperature source Oral, resp. rate 18, height 5\' 3"  (1.6 m), weight 171 lb (77.565 kg), last menstrual period 09/30/2014.  Maternal Exam:  Uterine Assessment: Contraction frequency is rare.  Abdomen: Gravid, non tender. Fundal height is aga.  Normal external genitalia, vulva, cervix, uterus and adnexa.  No lesions noted on exam.  Pelvis adequate for delivery.  Fetal presentation: Vertex by Leopold's  Fetal Exam:  Monitor Surveillance : Continuous Monitoring / Intermitting per -  Mode: Ultrasound.  NICHD: Category CTXs: Q 6-418minutes EFW   7 lbs  Physical Exam: Nursing note and vitals reviewed General: alert and cooperative She appears well nourished Psychiatric: Normal mood and affect. Her behavior is normal Head: Normocephalic Eyes: Pupils are equal, round, and reactive to light Neck: Normal range of motion Cardiovascular: RRR without murmur  Respiratory: CTAB. Effort normal  Abd: soft, non-tender, +BS, no rebound, no guarding  Genitourinary: Vagina normal  Neurological: A&Ox3 Skin: Warm and dry  Musculoskeletal: Normal range of motion  Homan's sign negative bilaterally No evidence of DVTs.  Edema: Minimal bilaterally non-pitting edema DTR: 2+ Clonus: None  Prenatal labs: ABO, Rh: --/--/O POS (11/28 1815) Antibody:  negative Rubella:   Non Immune RPR:   NR HBsAg:   negative HIV:   NR GBS:   positive Sickle cell/Hgb electrophoresis:  WNL Pap:  wnl 12/25/14 GC:   negative Chlamydia: negative Genetic screenings:  wnl Glucola:  enl  Assessment:  IUP at 38.1 weeks NICHD: Category 1 Membranes: intact GBS positive Diagnosis: early labor  Plan:  Admit to BS for un-schedule repeat CS d/t regular ctx R&B of CS reviewed with patient and family.  Pt and family verbalize understanding of the procedure and  agree with treatment plan.  Possibility of needing a blood transfusion reviewed.   Pt will accept blood products.   Routine Pre-OP orders Ancef per protocol May have a clear/thin diet 6 hours after CS, advance as tolerate 12 hours after CS      Smrithi Pigford, CNM, MSN 06/23/2015, 2:53 AM

## 2015-06-23 NOTE — Discharge Instructions (Signed)

## 2015-06-27 ENCOUNTER — Other Ambulatory Visit: Payer: Self-pay | Admitting: Obstetrics and Gynecology

## 2015-06-27 ENCOUNTER — Encounter (HOSPITAL_COMMUNITY)
Admission: RE | Admit: 2015-06-27 | Discharge: 2015-06-27 | Disposition: A | Discharge status: Home / Self Care | Payer: Medicaid Other | Source: Ambulatory Visit | Attending: Obstetrics and Gynecology | Admitting: Obstetrics and Gynecology

## 2015-06-27 ENCOUNTER — Encounter (HOSPITAL_COMMUNITY): Payer: Self-pay

## 2015-06-27 DIAGNOSIS — Z01818 Encounter for other preprocedural examination: Secondary | ICD-10-CM | POA: Diagnosis not present

## 2015-06-27 LAB — CBC
HEMATOCRIT: 34.2 % — AB (ref 36.0–46.0)
HEMOGLOBIN: 11.4 g/dL — AB (ref 12.0–15.0)
MCH: 27 pg (ref 26.0–34.0)
MCHC: 33.3 g/dL (ref 30.0–36.0)
MCV: 80.9 fL (ref 78.0–100.0)
PLATELETS: 230 10*3/uL (ref 150–400)
RBC: 4.23 MIL/uL (ref 3.87–5.11)
RDW: 15.5 % (ref 11.5–15.5)
WBC: 7 10*3/uL (ref 4.0–10.5)

## 2015-06-27 LAB — TYPE AND SCREEN
ABO/RH(D): O POS
ANTIBODY SCREEN: NEGATIVE

## 2015-06-27 LAB — ABO/RH: ABO/RH(D): O POS

## 2015-06-27 NOTE — Patient Instructions (Signed)
Your procedure is scheduled on:06/30/15  Enter through the Main Entrance at :12 noon Pick up desk phone and dial [phone] and inform us of your arrival.  Please call [phone] if you have any problems the morning of surgery.  Remember: Do not eat food after midnight:Sunday Clear liquids are ok until:9am on Monday   You may brush your teeth the morning of surgery.  Take these meds the morning of surgery with a sip of water:none   DO NOT wear jewelry, eye make-up, lipstick,body lotion, or dark fingernail polish.  (Polished toes are ok) You may wear deodorant.  If you are to be admitted after surgery, leave suitcase in car until your room has been assigned. Patients discharged on the day of surgery will not be allowed to drive home. Wear loose fitting, comfortable clothes for your ride home.

## 2015-06-28 LAB — RPR: RPR Ser Ql: NONREACTIVE

## 2015-06-30 ENCOUNTER — Encounter (HOSPITAL_COMMUNITY): Payer: Self-pay | Admitting: Anesthesiology

## 2015-06-30 ENCOUNTER — Inpatient Hospital Stay (HOSPITAL_COMMUNITY): Payer: Medicaid Other | Admitting: Certified Registered Nurse Anesthetist

## 2015-06-30 ENCOUNTER — Inpatient Hospital Stay (HOSPITAL_COMMUNITY)
Admission: RE | Admit: 2015-06-30 | Discharge: 2015-07-03 | DRG: 766 | Disposition: A | Discharge status: Home / Self Care | Payer: Medicaid Other | Source: Ambulatory Visit | Attending: Obstetrics and Gynecology | Admitting: Obstetrics and Gynecology

## 2015-06-30 ENCOUNTER — Encounter (HOSPITAL_COMMUNITY)
Admission: RE | Disposition: A | Discharge status: Home / Self Care | Payer: Self-pay | Source: Ambulatory Visit | Attending: Obstetrics and Gynecology

## 2015-06-30 DIAGNOSIS — O99824 Streptococcus B carrier state complicating childbirth: Secondary | ICD-10-CM | POA: Diagnosis present

## 2015-06-30 DIAGNOSIS — Z302 Encounter for sterilization: Secondary | ICD-10-CM | POA: Diagnosis not present

## 2015-06-30 DIAGNOSIS — O9902 Anemia complicating childbirth: Secondary | ICD-10-CM | POA: Diagnosis present

## 2015-06-30 DIAGNOSIS — Z3A39 39 weeks gestation of pregnancy: Secondary | ICD-10-CM | POA: Diagnosis present

## 2015-06-30 DIAGNOSIS — O3421 Maternal care for scar from previous cesarean delivery: Secondary | ICD-10-CM | POA: Diagnosis present

## 2015-06-30 DIAGNOSIS — Z9851 Tubal ligation status: Secondary | ICD-10-CM

## 2015-06-30 SURGERY — Surgical Case
Anesthesia: Spinal | Site: Abdomen | Laterality: Bilateral

## 2015-06-30 MED ORDER — MENTHOL 3 MG MT LOZG: 1.0000 | LOZENGE | OROMUCOSAL | Status: DC | PRN

## 2015-06-30 MED ORDER — ONDANSETRON HCL 4 MG/2ML IJ SOLN: 4.0000 mg | Freq: Three times a day (TID) | INTRAMUSCULAR | Status: DC | PRN

## 2015-06-30 MED ORDER — LACTATED RINGERS IV SOLN
INTRAVENOUS | Status: DC
  Administered 2015-06-30 (×3): via INTRAVENOUS

## 2015-06-30 MED ORDER — FERROUS SULFATE 325 (65 FE) MG PO TABS
325.0000 mg | ORAL_TABLET | Freq: Two times a day (BID) | ORAL | Status: DC
  Administered 2015-07-01 – 2015-07-03 (×5): 325 mg via ORAL
  Filled 2015-06-30 (×5): qty 1

## 2015-06-30 MED ORDER — CEFAZOLIN SODIUM-DEXTROSE 2-3 GM-% IV SOLR
2.0000 g | Freq: Once | INTRAVENOUS | Status: AC
  Administered 2015-06-30: 2 g via INTRAVENOUS
  Filled 2015-06-30: qty 50

## 2015-06-30 MED ORDER — DIPHENHYDRAMINE HCL 25 MG PO CAPS: 25.0000 mg | ORAL_CAPSULE | Freq: Four times a day (QID) | ORAL | Status: DC | PRN

## 2015-06-30 MED ORDER — OXYTOCIN 10 UNIT/ML IJ SOLN
40.0000 [IU] | INTRAVENOUS | Status: DC | PRN
  Administered 2015-06-30: 40 [IU] via INTRAVENOUS

## 2015-06-30 MED ORDER — LACTATED RINGERS IV SOLN
INTRAVENOUS | Status: DC
  Administered 2015-06-30: 20:00:00 via INTRAVENOUS

## 2015-06-30 MED ORDER — NALBUPHINE HCL 10 MG/ML IJ SOLN: 5.0000 mg | INTRAMUSCULAR | Status: DC | PRN

## 2015-06-30 MED ORDER — ZOLPIDEM TARTRATE 5 MG PO TABS: 5.0000 mg | ORAL_TABLET | Freq: Every evening | ORAL | Status: DC | PRN

## 2015-06-30 MED ORDER — WITCH HAZEL-GLYCERIN EX PADS: 1.0000 "application " | MEDICATED_PAD | CUTANEOUS | Status: DC | PRN

## 2015-06-30 MED ORDER — ONDANSETRON HCL 4 MG/2ML IJ SOLN
INTRAMUSCULAR | Status: DC | PRN
  Administered 2015-06-30: 4 mg via INTRAVENOUS

## 2015-06-30 MED ORDER — SCOPOLAMINE 1 MG/3DAYS TD PT72
1.0000 | MEDICATED_PATCH | Freq: Once | TRANSDERMAL | Status: DC
  Administered 2015-06-30: 1.5 mg via TRANSDERMAL

## 2015-06-30 MED ORDER — PHENYLEPHRINE 8 MG IN D5W 100 ML (0.08MG/ML) PREMIX OPTIME
INJECTION | INTRAVENOUS | Status: DC | PRN
  Administered 2015-06-30: 60 ug/min via INTRAVENOUS

## 2015-06-30 MED ORDER — DIPHENHYDRAMINE HCL 50 MG/ML IJ SOLN: 12.5000 mg | INTRAMUSCULAR | Status: DC | PRN

## 2015-06-30 MED ORDER — MORPHINE SULFATE 0.5 MG/ML IJ SOLN
INTRAMUSCULAR | Status: AC
  Filled 2015-06-30: qty 10

## 2015-06-30 MED ORDER — OXYCODONE-ACETAMINOPHEN 5-325 MG PO TABS
1.0000 | ORAL_TABLET | ORAL | Status: DC | PRN
  Administered 2015-07-01: 1 via ORAL
  Filled 2015-06-30: qty 1

## 2015-06-30 MED ORDER — CEFAZOLIN SODIUM-DEXTROSE 2-3 GM-% IV SOLR
INTRAVENOUS | Status: AC
Start: 2015-06-30 — End: 2015-07-01
  Filled 2015-06-30: qty 50

## 2015-06-30 MED ORDER — LANOLIN HYDROUS EX OINT: 1.0000 "application " | TOPICAL_OINTMENT | CUTANEOUS | Status: DC | PRN

## 2015-06-30 MED ORDER — LACTATED RINGERS IV SOLN
INTRAVENOUS | Status: DC | PRN
  Administered 2015-06-30: 15:00:00 via INTRAVENOUS

## 2015-06-30 MED ORDER — SIMETHICONE 80 MG PO CHEW
80.0000 mg | CHEWABLE_TABLET | ORAL | Status: DC
  Administered 2015-07-01 – 2015-07-03 (×4): 80 mg via ORAL
  Filled 2015-06-30 (×3): qty 1

## 2015-06-30 MED ORDER — BUPIVACAINE HCL (PF) 0.25 % IJ SOLN
INTRAMUSCULAR | Status: AC
  Filled 2015-06-30: qty 30

## 2015-06-30 MED ORDER — METHYLERGONOVINE MALEATE 0.2 MG/ML IJ SOLN: 0.2000 mg | INTRAMUSCULAR | Status: DC | PRN

## 2015-06-30 MED ORDER — SIMETHICONE 80 MG PO CHEW
80.0000 mg | CHEWABLE_TABLET | Freq: Three times a day (TID) | ORAL | Status: DC
  Administered 2015-06-30 – 2015-07-03 (×8): 80 mg via ORAL
  Filled 2015-06-30 (×9): qty 1

## 2015-06-30 MED ORDER — OXYTOCIN 10 UNIT/ML IJ SOLN
INTRAMUSCULAR | Status: AC
  Filled 2015-06-30: qty 4

## 2015-06-30 MED ORDER — KETOROLAC TROMETHAMINE 30 MG/ML IJ SOLN
INTRAMUSCULAR | Status: AC
  Administered 2015-06-30: 30 mg via INTRAMUSCULAR
  Filled 2015-06-30: qty 1

## 2015-06-30 MED ORDER — SENNOSIDES-DOCUSATE SODIUM 8.6-50 MG PO TABS
2.0000 | ORAL_TABLET | ORAL | Status: DC
  Administered 2015-07-01 – 2015-07-03 (×3): 2 via ORAL
  Filled 2015-06-30 (×3): qty 2

## 2015-06-30 MED ORDER — PHENYLEPHRINE 8 MG IN D5W 100 ML (0.08MG/ML) PREMIX OPTIME
INJECTION | INTRAVENOUS | Status: AC
  Filled 2015-06-30: qty 100

## 2015-06-30 MED ORDER — OXYTOCIN 40 UNITS IN LACTATED RINGERS INFUSION - SIMPLE MED: 62.5000 mL/h | INTRAVENOUS | Status: AC

## 2015-06-30 MED ORDER — OXYCODONE-ACETAMINOPHEN 5-325 MG PO TABS
2.0000 | ORAL_TABLET | ORAL | Status: DC | PRN
  Administered 2015-07-01 – 2015-07-02 (×3): 2 via ORAL
  Filled 2015-06-30 (×3): qty 2

## 2015-06-30 MED ORDER — BUPIVACAINE HCL (PF) 0.25 % IJ SOLN
INTRAMUSCULAR | Status: DC | PRN
  Administered 2015-06-30: 20 mL

## 2015-06-30 MED ORDER — ONDANSETRON HCL 4 MG/2ML IJ SOLN
INTRAMUSCULAR | Status: AC
  Filled 2015-06-30: qty 2

## 2015-06-30 MED ORDER — NALBUPHINE HCL 10 MG/ML IJ SOLN: 5.0000 mg | Freq: Once | INTRAMUSCULAR | Status: DC | PRN

## 2015-06-30 MED ORDER — TETANUS-DIPHTH-ACELL PERTUSSIS 5-2.5-18.5 LF-MCG/0.5 IM SUSP: 0.5000 mL | Freq: Once | INTRAMUSCULAR | Status: DC

## 2015-06-30 MED ORDER — SCOPOLAMINE 1 MG/3DAYS TD PT72
MEDICATED_PATCH | TRANSDERMAL | Status: AC
  Administered 2015-06-30: 1.5 mg via TRANSDERMAL
  Filled 2015-06-30: qty 1

## 2015-06-30 MED ORDER — MEPERIDINE HCL 25 MG/ML IJ SOLN: 6.2500 mg | INTRAMUSCULAR | Status: DC | PRN

## 2015-06-30 MED ORDER — PROMETHAZINE HCL 25 MG/ML IJ SOLN: 6.2500 mg | INTRAMUSCULAR | Status: DC | PRN

## 2015-06-30 MED ORDER — KETOROLAC TROMETHAMINE 30 MG/ML IJ SOLN: 30.0000 mg | Freq: Once | INTRAMUSCULAR | Status: DC | PRN

## 2015-06-30 MED ORDER — SODIUM CHLORIDE 0.9 % IJ SOLN: 3.0000 mL | INTRAMUSCULAR | Status: DC | PRN

## 2015-06-30 MED ORDER — FENTANYL CITRATE (PF) 100 MCG/2ML IJ SOLN
INTRAMUSCULAR | Status: DC | PRN
  Administered 2015-06-30: 12.5 ug via INTRATHECAL

## 2015-06-30 MED ORDER — BUPIVACAINE IN DEXTROSE 0.75-8.25 % IT SOLN
INTRATHECAL | Status: DC | PRN
  Administered 2015-06-30: 1 mL via INTRATHECAL

## 2015-06-30 MED ORDER — NALOXONE HCL 0.4 MG/ML IJ SOLN: 0.4000 mg | INTRAMUSCULAR | Status: DC | PRN

## 2015-06-30 MED ORDER — MORPHINE SULFATE (PF) 0.5 MG/ML IJ SOLN
INTRAMUSCULAR | Status: DC | PRN
  Administered 2015-06-30: .2 mg via INTRATHECAL

## 2015-06-30 MED ORDER — KETOROLAC TROMETHAMINE 30 MG/ML IJ SOLN: 30.0000 mg | Freq: Four times a day (QID) | INTRAMUSCULAR | Status: DC | PRN

## 2015-06-30 MED ORDER — HYDROMORPHONE HCL 1 MG/ML IJ SOLN: 0.2500 mg | INTRAMUSCULAR | Status: DC | PRN

## 2015-06-30 MED ORDER — DEXTROSE 5 % IV SOLN: 1.0000 ug/kg/h | INTRAVENOUS | Status: DC | PRN

## 2015-06-30 MED ORDER — SIMETHICONE 80 MG PO CHEW: 80.0000 mg | CHEWABLE_TABLET | ORAL | Status: DC | PRN

## 2015-06-30 MED ORDER — FENTANYL CITRATE (PF) 100 MCG/2ML IJ SOLN
INTRAMUSCULAR | Status: AC
  Filled 2015-06-30: qty 2

## 2015-06-30 MED ORDER — DIPHENHYDRAMINE HCL 25 MG PO CAPS
25.0000 mg | ORAL_CAPSULE | ORAL | Status: DC | PRN
  Administered 2015-06-30: 25 mg via ORAL
  Filled 2015-06-30: qty 1

## 2015-06-30 MED ORDER — PRENATAL MULTIVITAMIN CH
1.0000 | ORAL_TABLET | Freq: Every day | ORAL | Status: DC
  Administered 2015-07-01 – 2015-07-03 (×3): 1 via ORAL
  Filled 2015-06-30 (×3): qty 1

## 2015-06-30 MED ORDER — KETOROLAC TROMETHAMINE 30 MG/ML IJ SOLN
30.0000 mg | Freq: Four times a day (QID) | INTRAMUSCULAR | Status: DC | PRN
  Administered 2015-06-30: 30 mg via INTRAMUSCULAR

## 2015-06-30 MED ORDER — ACETAMINOPHEN 325 MG PO TABS: 650.0000 mg | ORAL_TABLET | ORAL | Status: DC | PRN

## 2015-06-30 MED ORDER — DIBUCAINE 1 % RE OINT: 1.0000 "application " | TOPICAL_OINTMENT | RECTAL | Status: DC | PRN

## 2015-06-30 MED ORDER — MEASLES, MUMPS & RUBELLA VAC ~~LOC~~ INJ: 0.5000 mL | INJECTION | Freq: Once | SUBCUTANEOUS | Status: DC

## 2015-06-30 MED ORDER — IBUPROFEN 600 MG PO TABS
600.0000 mg | ORAL_TABLET | Freq: Four times a day (QID) | ORAL | Status: DC
  Administered 2015-07-01 – 2015-07-03 (×11): 600 mg via ORAL
  Filled 2015-06-30 (×11): qty 1

## 2015-06-30 MED ORDER — METHYLERGONOVINE MALEATE 0.2 MG PO TABS: 0.2000 mg | ORAL_TABLET | ORAL | Status: DC | PRN

## 2015-06-30 MED ORDER — IBUPROFEN 600 MG PO TABS: 600.0000 mg | ORAL_TABLET | Freq: Four times a day (QID) | ORAL | Status: DC | PRN

## 2015-06-30 SURGICAL SUPPLY — 36 items
BENZOIN TINCTURE PRP APPL 2/3 (GAUZE/BANDAGES/DRESSINGS) ×3 IMPLANT
BOOTIES KNEE HIGH SLOAN (MISCELLANEOUS) ×6 IMPLANT
CLAMP CORD UMBIL (MISCELLANEOUS) IMPLANT
CLOSURE WOUND 1/2 X4 (GAUZE/BANDAGES/DRESSINGS) ×1
CLOTH BEACON ORANGE TIMEOUT ST (SAFETY) ×3 IMPLANT
DRAIN JACKSON PRT FLT 10 (DRAIN) IMPLANT
DRAPE SHEET LG 3/4 BI-LAMINATE (DRAPES) IMPLANT
DRSG OPSITE POSTOP 4X10 (GAUZE/BANDAGES/DRESSINGS) ×3 IMPLANT
DURAPREP 26ML APPLICATOR (WOUND CARE) ×3 IMPLANT
ELECT REM PT RETURN 9FT ADLT (ELECTROSURGICAL) ×3
ELECTRODE REM PT RTRN 9FT ADLT (ELECTROSURGICAL) ×1 IMPLANT
EVACUATOR SILICONE 100CC (DRAIN) IMPLANT
EXTRACTOR VACUUM M CUP 4 TUBE (SUCTIONS) IMPLANT
EXTRACTOR VACUUM M CUP 4' TUBE (SUCTIONS)
GLOVE BIOGEL PI IND STRL 7.0 (GLOVE) ×1 IMPLANT
GLOVE BIOGEL PI INDICATOR 7.0 (GLOVE) ×2
GLOVE ECLIPSE 6.5 STRL STRAW (GLOVE) ×3 IMPLANT
GOWN STRL REUS W/TWL LRG LVL3 (GOWN DISPOSABLE) ×6 IMPLANT
KIT ABG SYR 3ML LUER SLIP (SYRINGE) IMPLANT
NEEDLE HYPO 22GX1.5 SAFETY (NEEDLE) ×3 IMPLANT
NEEDLE HYPO 25X5/8 SAFETYGLIDE (NEEDLE) IMPLANT
NS IRRIG 1000ML POUR BTL (IV SOLUTION) ×6 IMPLANT
PACK C SECTION WH (CUSTOM PROCEDURE TRAY) ×3 IMPLANT
PAD OB MATERNITY 4.3X12.25 (PERSONAL CARE ITEMS) ×3 IMPLANT
RTRCTR C-SECT PINK 25CM LRG (MISCELLANEOUS) ×3 IMPLANT
STRIP CLOSURE SKIN 1/2X4 (GAUZE/BANDAGES/DRESSINGS) ×2 IMPLANT
SUT MNCRL AB 3-0 PS2 27 (SUTURE) ×3 IMPLANT
SUT SILK 2 0 FSL 18 (SUTURE) IMPLANT
SUT VIC AB 0 CTX 36 (SUTURE) ×6
SUT VIC AB 0 CTX36XBRD ANBCTRL (SUTURE) ×3 IMPLANT
SUT VIC AB 1 CT1 36 (SUTURE) ×6 IMPLANT
SUT VIC AB 2-0 CT1 27 (SUTURE) ×12
SUT VIC AB 2-0 CT1 TAPERPNT 27 (SUTURE) ×6 IMPLANT
SYR 20CC LL (SYRINGE) ×3 IMPLANT
TOWEL OR 17X24 6PK STRL BLUE (TOWEL DISPOSABLE) ×3 IMPLANT
TRAY FOLEY CATH SILVER 14FR (SET/KITS/TRAYS/PACK) ×3 IMPLANT

## 2015-06-30 NOTE — Op Note (Signed)
Preoperative diagnosis: Intrauterine pregnancy at 39 weeks and 2 days with 2 previous cesarean sections and desire for sterilization  Post operative diagnosis: Same  Anesthesia: Spinal  Anesthesiologist: Dr. Hatchett  Procedure: Repeat low transverse cesarean section with bilateArby Barretteral salpyngectomy  Surgeon: Dr. Dois DavenportSandra Wisdom Rickey  Assistant: Henreitta LeberElmira Powell PA-C  Estimated blood loss: 700 cc  Procedure:  After being informed of the planned procedure and possible complications including bleeding, infection, injury to other organs, informed consent is obtained. The patient is taken to OR #9 and given spinal anesthesia without complication. She is placed in the dorsal decubitus position with the pelvis tilted to the left. She is then prepped and draped in Haynes sterile fashion. Haynes Foley catheter is inserted in her bladder.  After assessing adequate level of anesthesia, we infiltrate the suprapubic area with 20 cc of Marcaine 0.25 and perform Haynes Pfannenstiel incision which is brought down sharply to the fascia. The fascia is entered in Haynes low transverse fashion. Linea alba is dissected. Peritoneum is entered in Haynes midline fashion. An Alexis retractor is easily positioned.   The myometrium is then entered in Haynes low transverse fashion, 2 cm above the vesico-uterine junction ; first with knife and then extended bluntly. Amniotic fluid is clear. We assist the birth of Haynes female  infant in vertex presentation. Mouth and nose are suctioned. The baby is delivered. The cord is clamped and sectioned. The baby is given to the neonatologist present in the room.  10 cc of blood is drawn from the umbilical vein.The placenta is allowed to deliver spontaneously. It is complete and the cord has 3 vessels. Uterine revision is negative.  We proceed with closure of the myometrium in 2 layers: First with Haynes running locked suture of 0 Vicryl, then with Haynes Lembert suture of 0 Vicryl imbricating the first one. Hemostasis is completed with  cauterization on peritoneal edges and 2 figure-of-eight sutures of 0-Vicryl midline.  Both paracolic gutters are cleaned. Both tubes and ovaries are assessed and normal.   We then proceed with bilateral salpyngectomy for sterilization. Each tube is grasped with Haynes Babcock forceps. Using 2 Rogers clamp, we isolate the tube by clamping the mesosalpynx. The tube is sharply excised and the mesosalpynx is sutured with 2 transfixed sutures of 2-0 vicryl. Hemostasis is adequate   The pelvis is profusely irrigated with warm saline to confirm Haynes satisfactory hemostasis.  Retractors and sponges are removed. Under fascia hemostasis is completed with cauterization. The fascia is then closed with 2 running sutures of 0 Vicryl meeting midline. The wound is irrigated with warm saline and hemostasis is completed with cauterization. The skin is closed with Haynes subcuticular suture of 3-0 Monocryl and Steri-Strips.  Instrument and sponge count is complete x2. Estimated blood loss is 700 cc.  The procedure is well tolerated by the patient who is taken to recovery room in Haynes well and stable condition.  female baby named Karmen BongoMateo Julian was born at 14:50 and received an Apgar of 9  at 1 minute and 9 at 5 minutes.    Specimen: Placenta sent to L & D, tubes to pathology   [facility] HospitalRIVARD,Melanie Klonowski A MD 7/18/20164:27 PM

## 2015-06-30 NOTE — Anesthesia Postprocedure Evaluation (Signed)
Anesthesia Post Note  Patient: Melanie Haynes  Procedure(s) Performed: Procedure(s) (LRB): CESAREAN SECTION WITH BILATERAL TUBAL LIGATION (Bilateral)  Anesthesia type: Spinal  Patient location: PACU  Post pain: Pain level controlled  Post assessment: Post-op Vital signs reviewed  Last Vitals:  Filed Vitals:   06/30/15 1630  BP: 111/78  Pulse: 92  Temp:   Resp: 18    Post vital signs: Reviewed  Level of consciousness: awake  Complications: No apparent anesthesia complications

## 2015-06-30 NOTE — Transfer of Care (Signed)
Immediate Anesthesia Transfer of Care Note  Patient: Melanie Haynes  Procedure(s) Performed: Procedure(s): CESAREAN SECTION WITH BILATERAL TUBAL LIGATION (Bilateral)  Patient Location: PACU  Anesthesia Type:Spinal  Level of Consciousness: awake  Airway & Oxygen Therapy: Patient Spontanous Breathing  Post-op Assessment: Report given to RN  Post vital signs: Reviewed and stable  Last Vitals:  Filed Vitals:   06/30/15 1213  BP: 127/85  Pulse: 96  Temp: 36.7 C  Resp: 16    Complications: No apparent anesthesia complications

## 2015-06-30 NOTE — Anesthesia Procedure Notes (Signed)
Spinal Patient location during procedure: OR Start time: 06/30/2015 2:14 PM End time: 06/30/2015 2:17 PM Staffing Anesthesiologist: Leilani AbleHATCHETT, Daryus Sowash Performed by: anesthesiologist  Preanesthetic Checklist Completed: patient identified, surgical consent, pre-op evaluation, timeout performed, IV checked, risks and benefits discussed and monitors and equipment checked Spinal Block Patient position: sitting Prep: site prepped and draped and DuraPrep Patient monitoring: heart rate, cardiac monitor, continuous pulse ox and blood pressure Approach: midline Location: L3-4 Injection technique: single-shot Needle Needle type: Pencan  Needle gauge: 24 G Needle length: 9 cm Needle insertion depth: 6 cm Assessment Sensory level: T10

## 2015-06-30 NOTE — Anesthesia Preprocedure Evaluation (Signed)
Anesthesia Evaluation  Patient identified by MRN, date of birth, ID band Patient awake    Reviewed: Allergy & Precautions, H&P , NPO status , Patient's Chart, lab work & pertinent test results  Airway Mallampati: I  TM Distance: >3 FB Neck ROM: full    Dental no notable dental hx. (+) Teeth Intact   Pulmonary neg pulmonary ROS,    Pulmonary exam normal       Cardiovascular negative cardio ROS Normal cardiovascular exam    Neuro/Psych negative neurological ROS  negative psych ROS   GI/Hepatic negative GI ROS, Neg liver ROS,   Endo/Other  negative endocrine ROS  Renal/GU negative Renal ROS     Musculoskeletal   Abdominal Normal abdominal exam  (+)   Peds  Hematology negative hematology ROS (+)   Anesthesia Other Findings   Reproductive/Obstetrics (+) Pregnancy                             Anesthesia Physical Anesthesia Plan  ASA: II  Anesthesia Plan: Spinal   Post-op Pain Management:    Induction:   Airway Management Planned:   Additional Equipment:   Intra-op Plan:   Post-operative Plan:   Informed Consent: I have reviewed the patients History and Physical, chart, labs and discussed the procedure including the risks, benefits and alternatives for the proposed anesthesia with the patient or authorized representative who has indicated his/her understanding and acceptance.     Plan Discussed with: CRNA and Surgeon  Anesthesia Plan Comments:         Anesthesia Quick Evaluation

## 2015-06-30 NOTE — H&P (Signed)
      Expand All Collapse All   Melanie BloomClaudia L Celani is a 34 y.o. female, G4 P2 at 39+  weeks to undergo a repeat cesarean section  Pregnancy Course: Patient entered care at 14.4 weeks.  EDC of 07/05/15 was established by LMP.    US evaluations: 15.4 weeks - single. vertex. placenta appears right lateral. fluid is normal vertical pocket 3.6 cm. R ovary wnl. L ovary not visualized. Cx closed. Measured TV. No funneling seen. 19.3 weeks - Anatomy: EFW 10oz - 38%, FHR 146, cervical length 3.62, vertex, posterior placenta, heart and RVOT not seen, female  23.4 weeks - FU: cervical length 4.75, EFW 1lb 5oz - 34.9%,  27.4 weeks - FU: EFW 2lb 8oz - 52%, WFR 140, vertex, RVOT seen, 4ch seen, no funneling 30.4 weeks - FU: EFW 3lb 6oz - 27.9%, AFI 13.51, FHR 158, vertex, Right lateral placenta. Significant prenatal events: Hx 22 week loss, refused 17P and a cerclage  Last evaluation: 37.4 weeks VE:2/50/-2 on 06/18/15  Patient denies contractions, LOF or bleeding and reports good FM.  OB History    Gravida Para Term Preterm AB TAB SAB Ectopic Multiple Living   4 2 2  1  1   2      Past Medical History  Diagnosis Date  . Medical history non-contributory    Past Surgical History  Procedure Laterality Date  . Cesarean section     Family History: family history is not on file. Social History:  reports that she has never smoked. She does not have any smokeless tobacco history on file. She reports that she does not drink alcohol or use illicit drugs.  Prenatal Transfer Tool  Maternal Diabetes:No Genetic Screening:Declined Maternal Ultrasounds/Referrals: Normal Fetal Ultrasounds or other Referrals: None Maternal Substance Abuse: No Significant Maternal Medications: None Significant Maternal Lab Results: Lab values include:  Group B Strep positive, Other: RNI  ROS: See HPI above, all other systems are negative  No Known Allergies  Physical Exam:  General: alert and cooperative She appears well nourished Psychiatric: Normal mood and affect. Her behavior is normal Head: Normocephalic Eyes: Pupils are equal, round, and reactive to light Neck: Normal range of motion Cardiovascular: RRR without murmur  Respiratory: CTAB. Effort normal  Abd: soft, non-tender, +BS, no rebound, no guarding  Neurological: A&Ox3 Skin: Warm and dry  Musculoskeletal: Normal range of motion  Homan's sign negative bilaterally No evidence of DVTs.  Edema: Minimal bilaterally non-pitting edema  Prenatal labs: ABO, Rh: --/--/O POS (11/28 1815) Antibody:  negative Rubella:  Non Immune RPR:   NR HBsAg:   negative HIV:   NR GBS:   positive Sickle cell/Hgb electrophoresis: WNL Pap: wnl 12/25/14 GC: negative Chlamydia: negative Genetic screenings: wnl Glucola: enl  Assessment:   39 weeks for repeat cesarean section   Plan:   Proceed with repeat cesarean section.  Cesarean section reviewed with pt with R&B including but not limited to:  bleeding, infection, injury to other organs. Low transverse approach planned which will allow vaginal delivery with future pregnancies. Should a vertical incision or inverted T be needed, patient is aware that repeat cesarean sections would be recommended in the future. Expected hospital stay and recovery also discussed.

## 2015-06-30 NOTE — Consult Note (Signed)
Neonatology Note:   Attendance at C-section:    I was asked by Dr. Estanislado Pandyivard to attend this repeat C/S at term. The mother is a G4P2A1 O pos, GBS pos with an uncomplicated pregnancy. ROM at delivery, fluid clear. Infant vigorous with good spontaneous cry and tone. Needed no suctioning. Ap 9/9. Lungs clear to ausc in DR. To CN to care of Pediatrician.   Doretha Souhristie C. Aileena Iglesia, MD

## 2015-07-01 ENCOUNTER — Encounter (HOSPITAL_COMMUNITY): Payer: Self-pay | Admitting: Obstetrics and Gynecology

## 2015-07-01 LAB — CBC
HEMATOCRIT: 28.6 % — AB (ref 36.0–46.0)
Hemoglobin: 9.2 g/dL — ABNORMAL LOW (ref 12.0–15.0)
MCH: 26.2 pg (ref 26.0–34.0)
MCHC: 32.2 g/dL (ref 30.0–36.0)
MCV: 81.5 fL (ref 78.0–100.0)
Platelets: 187 10*3/uL (ref 150–400)
RBC: 3.51 MIL/uL — ABNORMAL LOW (ref 3.87–5.11)
RDW: 15.6 % — AB (ref 11.5–15.5)
WBC: 7.2 10*3/uL (ref 4.0–10.5)

## 2015-07-01 LAB — BIRTH TISSUE RECOVERY COLLECTION (PLACENTA DONATION)

## 2015-07-01 NOTE — Clinical Social Work Maternal (Signed)
  CLINICAL SOCIAL WORK MATERNAL/CHILD NOTE  Patient Details  Name: Melanie Haynes MRN: [id number] Date of Birth: 1981/11/07  Date:  07/01/2015  Clinical Social Worker Initiating Note:  Loleta BooksSarah Sidra Oldfield, LCSW Date/ Time Initiated:  07/01/15/1015     Child's Name:  [name]   Legal Guardian:  Melanie Haynes and Melanie BachJuan Haynes  Need for Interpreter:  None   Date of Referral:  06/30/15     Reason for Referral:  History of panic attacks  [facility]   Address:  [address] [place], [place] [address]  Phone number:  [phone]   Household Members:  Minor Children, Significant Other   Natural Supports (not living in the home):  Immediate Family, Extended Family   Professional Supports: None   Employment: Homemaker   Type of Work:   N/A  Education:    N/A  Surveyor, quantityinancial Resources:  Medicaid   Other Resources:  Sales executiveood Stamps , WIC   Cultural/Religious Considerations Which May Impact Care:  None reported  Strengths:  Home prepared for child , Ability to meet basic needs    Risk Factors/Current Problems:   1)Mental Health Concerns: History of panic attacks. [name] reported that she has a rx for Xanax and is able to take PRN. [name] denied acute mental health symptoms during the pregnancy, and reported that she currently feels "great".  Cognitive State:  Able to Concentrate , Alert , Insightful , Linear Thinking    Mood/Affect:  Bright , Comfortable , Calm , Interested    CSW Assessment:  CSW received request for consult due to [name] presenting with a history of panic attacks.  [name] provided consent for the FOB to remain in the room during the assessment. FOB was holding and attending to the infant during the visit. [name] presented as easily engaged and receptive to the visit.  She was observed to be in a pleasant mood and displayed a full range in affect.    [name] denied current mental health questions, concerns, or needs.  She acknowledged a history of panic attacks, and  reported that she noted onset of "crying all the time and being emotional" immediately after her 2 previous children had been born. She stated that she would speak to her medical provider at postpartum visits, but that they never prescribed her any medications.  [name] shared that she currently "feels great" and reported that she has had no acute mental health concerns during the pregnancy.  [name] reported that she feels that she is more supported in comparison to previous pregnancies (different FOB), and denied feeling concerned about her mental health as she transitions to the postpartum period.  [name] shared that she has a current prescription for Xanax and is able to take one if she notes the onset of a panic attacks. She expressed feelings of confidence of knowing early indicators that her anxiety is worsening which provides her with time to self-regulate prior to a panic attack.  [name] confirmed that she feels well supported, and is excited and happy upon the birth of this infant.   [name] acknowledged ongoing CSW availability PRN during admission. She expressed appreciation for the visit and agreed to contact CSW if needs arise.  CSW Plan/Description:   1)Patient/Family Education: Perinatal mood and anxiety disorders 2)No Further Intervention Required/No Barriers to Discharge    Melanie Haynes, Melanie Haynes N, LCSW 07/01/2015, 12:24 PM

## 2015-07-01 NOTE — Addendum Note (Signed)
Addendum  created 07/01/15 0817 by Rosalia HammersMargaret S Snyder Colavito, CRNA   Modules edited: Notes Section   Notes Section:  File: [id number]

## 2015-07-01 NOTE — Progress Notes (Addendum)
Subjective: Postpartum Day 1: Cesarean Delivery due to repeat with BTL Patient up ad lib, reports no syncope or dizziness. Feeding:  breast Contraceptive plan:  BTL No circ  Objective: Vital signs in last 24 hours: Temp:  [97.7 F (36.5 C)-99 F (37.2 C)] 98.7 F (37.1 C) (07/19 0400) Pulse Rate:  [78-111] 78 (07/19 0400) Resp:  [15-18] 18 (07/19 0400) BP: (100-131)/(50-89) 100/50 mmHg (07/19 0400) SpO2:  [97 %-100 %] 98 % (07/19 0400)  Physical Exam:  General: alert and cooperative Lochia: appropriate Uterine Fundus: firm Abdomen:  + bowel sounds, non distended Incision: no significant drainage  Honeycomb dressing CDI DVT Evaluation: No evidence of DVT seen on physical exam. Homan's sign: Negative  Assessment: Status post Cesarean section day 2. Doing well postoperatively.  Honeycomb dressing in place, no significant drainage Anemia - hemodynamicly stable.    Plan: Continue current care. Discharge home and Lactation consult    Melanie Haynes, CNM, MSN 07/01/2015. 6:22 AM

## 2015-07-01 NOTE — Anesthesia Postprocedure Evaluation (Signed)
  Anesthesia Post-op Note  Patient: Melanie Haynes  Procedure(s) Performed: Procedure(s): CESAREAN SECTION WITH BILATERAL TUBAL LIGATION (Bilateral)  Patient Location: PACU and Mother/Baby  Anesthesia Type:Spinal  Level of Consciousness: awake, alert  and patient cooperative  Airway and Oxygen Therapy: Patient Spontanous Breathing  Post-op Pain: none  Post-op Assessment: Post-op Vital signs reviewed, No signs of Nausea or vomiting, Adequate PO intake, Pain level controlled, No headache, No backache and Patient able to bend at knees LLE Motor Response: Purposeful movement LLE Sensation: Tingling RLE Motor Response: Purposeful movement RLE Sensation: Increased, Tingling L Sensory Level: S1-Sole of foot, small toes R Sensory Level: S1-Sole of foot, small toes  Post-op Vital Signs: Reviewed and stable  Last Vitals:  Filed Vitals:   07/01/15 0400  BP: 100/50  Pulse: 78  Temp: 37.1 C  Resp: 18    Complications: No apparent anesthesia complications

## 2015-07-01 NOTE — Lactation Note (Signed)
This note was copied from the chart of Melanie Rosana FretClaudia Hewett. Lactation Consultation Note Mom stated she had tried to BF her 33 yr old but she didn't want to latch well and caused nipple pain. States this baby doing much better. Mom BF in cradle position w/blankets around baby. Mom doesn't have support under her arm and she is letting baby obtain a shallow latch. Encouraged and demonstrated what a deep latch from the baby can look and feel like. Mom has tubular shaped breast. Encouraged to massage breast at intervals during BF and not have wrapped in blanket. Mom encouraged to feed baby 8-12 times/24 hours and with feeding cues. Mom encouraged to waken baby for feeds. Encouraged to call for assistance if needed and to verify proper latch. Referred to Baby and Me Book in Breastfeeding section Pg. 22-23 for position options and Proper latch demonstration. Educated about newborn behavior. WH/LC brochure given w/resources, support groups and LC services. Patient Name: Melanie Rosana FretClaudia Dungan ZOXWR'UToday's Date: 07/01/2015 Reason for consult: Initial assessment   Maternal Data Has patient been taught Hand Expression?: Yes Does the patient have breastfeeding experience prior to this delivery?: Yes  Feeding Feeding Type: Breast Fed Length of feed: 15 min  LATCH Score/Interventions Latch: Grasps breast easily, tongue down, lips flanged, rhythmical sucking. Intervention(s): Adjust position;Assist with latch;Breast massage;Breast compression  Audible Swallowing: A few with stimulation Intervention(s): Hand expression;Alternate breast massage  Type of Nipple: Everted at rest and after stimulation  Comfort (Breast/Nipple): Soft / non-tender     Hold (Positioning): Assistance needed to correctly position infant at breast and maintain latch. Intervention(s): Breastfeeding basics reviewed;Support Pillows;Position options;Skin to skin  LATCH Score: 8  Lactation Tools Discussed/Used     Consult Status Consult  Status: Follow-up Date: 07/01/15 (in pm) Follow-up type: In-patient    Charyl DancerCARVER, Satchel Heidinger G 07/01/2015, 4:23 AM

## 2015-07-02 MED ORDER — HYDROCODONE-ACETAMINOPHEN 5-325 MG PO TABS
1.0000 | ORAL_TABLET | Freq: Four times a day (QID) | ORAL | Status: DC | PRN
  Administered 2015-07-02: 1 via ORAL
  Administered 2015-07-02: 2 via ORAL
  Filled 2015-07-02: qty 2
  Filled 2015-07-02: qty 1

## 2015-07-02 NOTE — Progress Notes (Addendum)
Subjective: Postpartum Day 2: Cesarean Delivery  Patient reports nausea and vomiting and dizziness especially after taking percocet.  Tolerating regular diet. Ambulating and voiding without difficulty. Incisional pain improves with pain medication use.  Objective:  Vital signs in last 24 hours: Temp:  [98.2 F (36.8 C)-98.9 F (37.2 C)] 98.4 F (36.9 C) (07/20 0534) Pulse Rate:  [79-91] 83 (07/20 0534) Resp:  [18] 18 (07/20 0534) BP: (95-118)/(51-65) 118/59 mmHg (07/20 0534) SpO2:  [98 %] 98 % (07/19 1359) Recent BP: 130/82 Pulse 91  Physical Exam:  General: alert, cooperative and no distress Lochia: appropriate Uterine Fundus: firm Incision: healing well, dried blood noted on dressing marked DVT Evaluation: No evidence of DVT seen on physical exam. No significant calf/ankle edema.   Recent Labs  07/01/15 0600  HGB 9.2*  HCT 28.6*    Assessment/Plan: Status post Cesarean section. Doing well postoperatively.  -d/c percocet, try vicodin for pain control. -If continues with symptoms of nausea/vomoting/dizziness need to further evaluate, do orthostatics.  -c/w iron and prenatal vitamins.   [facility] Hospital[facility]ULWA,Jayvon Mounger [facility] 07/02/2015, 12:46 PM

## 2015-07-03 DIAGNOSIS — Z9851 Tubal ligation status: Secondary | ICD-10-CM

## 2015-07-03 MED ORDER — HYDROCODONE-ACETAMINOPHEN 5-325 MG PO TABS: 1.0000 | ORAL_TABLET | Freq: Four times a day (QID) | ORAL | Status: AC | PRN

## 2015-07-03 MED ORDER — IBUPROFEN 600 MG PO TABS: 600.0000 mg | ORAL_TABLET | Freq: Four times a day (QID) | ORAL | Status: AC

## 2015-07-03 NOTE — Lactation Note (Signed)
This note was copied from the chart of Melanie Haynes. Lactation Consultation Note  Patient Name: Melanie Alauna Hayden ZOXWR'U Date: 07/03/2015 Reason for consult: Follow-up assessment Baby 68 hours old. Mom nursing baby in modified cradle position when this LC entered room. Enc mom to turn baby's chest facing hers so baby could swallow better. Asked mom if she has any nipple soreness and she said yes. Enc mom to support baby's head in cross-cradle position to maintain a deeper latch, and mom reported increased comfort. Baby maintained a deep latch, suckling rhythmically with intermittent swallows noted. Enc mom to continue to nurse with cues. Mom aware of OP/BFSG and LC phone line assistance after D/C.  Maternal Data    Feeding Feeding Type: Breast Fed Length of feed: 35 min  LATCH Score/Interventions Latch: Grasps breast easily, tongue down, lips flanged, rhythmical sucking. Intervention(s): Adjust position;Breast compression  Audible Swallowing: Spontaneous and intermittent  Type of Nipple: Everted at rest and after stimulation  Comfort (Breast/Nipple): Filling, red/small blisters or bruises, mild/mod discomfort  Problem noted: Mild/Moderate discomfort Interventions (Mild/moderate discomfort): Comfort gels  Hold (Positioning): Assistance needed to correctly position infant at breast and maintain latch. Intervention(s): Support Pillows;Position options  LATCH Score: 8  Lactation Tools Discussed/Used     Consult Status Consult Status: Complete    Geralynn Ochs 07/03/2015, 10:57 AM

## 2015-07-03 NOTE — Discharge Summary (Signed)
Cesarean Section Delivery Discharge Summary  Melanie Haynes  DOB:    [birth year] MRN:    [id number] CSN:    [id number]  Date of admission:                  06/30/2015  Date of discharge:                   07/03/2015  Procedures this admission: Repeat C/S with BTL  Date of Delivery: 06/30/2015  Newborn Data:  Live born female  Birth Weight: 7 lb 14.3 oz (3581 g) APGAR: 9, 9  Home with mother. Name: Unknown Circumcision Plan: None  History of Present Illness:  Melanie Haynes is a 34 y.o. female, [id number], who presents at [redacted]w[redacted]d weeks gestation. The patient has been followed at [facility] division [facility]   Her pregnancy has been complicated by:  Patient Active Problem List   Diagnosis Date Noted  . S/P tubal ligation 07/03/2015  . Cesarean delivery delivered 06/30/2015    Hospital Course--Scheduled Cesarean: Patient was admitted on 06/30/2015 for a scheduled repeat cesarean delivery with BTL.   She was taken to the operating room, where Dr. Kathie Rhodes. Rivard performed a repeat LTCS under spinal anesthesia, with delivery of a viable Female, with weight and Apgars as listed below. Infant was in good condition and remained at the patient's bedside.  The patient was taken to recovery in good condition.  Patient planned to breast feed.  On post-op day 1, patient was doing well, tolerating a regular diet, with Hgb of 9.2.  Throughout her stay, her physical exam was WNL, her incision was CDI, and her vital signs remained stable.  By post-op day 2, she was up ad lib, tolerating a regular diet, with good pain control with po med.  However, she was experiencing some N/V and dizziness which resolved after her pain medications was switched from percocet to vicodin. She was deemed to have received the full benefit of her hospital stay, and was discharged home in stable condition.  Contraceptive choice was BTL.     Feeding:  breast  Contraception:  bilateral tubal ligation  Hemoglobin Results:  CBC Latest Ref Rng 07/01/2015 06/27/2015 06/23/2015  WBC 4.0 - 10.5 K/uL 7.2 7.0 8.3  Hemoglobin 12.0 - 15.0 g/dL 1.3(Y) 11.4(L) 11.3(L)  Hematocrit 36.0 - 46.0 % 28.6(L) 34.2(L) 33.8(L)  Platelets 150 - 400 K/uL 187 230 242     Discharge Physical Exam:   General: alert, cooperative and no distress Mood/Affect: Appropriate Chest: clear to auscultation, no wheezes, rales or rhonchi, symmetric air entry.  CVS exam: normal rate and regular rhythm. Breast exam: not examined. Abdomen:  + bowel sounds, Soft, Appropriately Tender Uterine Fundus: firm, U/-3 Lochia: appropriate Incision: Honeycomb dressing, no significant drainage DVT Evaluation: No evidence of DVT seen on physical exam. Skin: Warm, Dry  Procedures and/or Complications Intrapartum Procedures: cesarean: low cervical, transverse Postpartum Procedures: none Complications-Operative and Postpartum: none   Discharge Information: Diagnoses: Term Pregnancy-delivered via Repeat C/S, S/P BTL Activity:  pelvic rest Diet:  routine Medications: PNV, Ibuprofen and Vicodin Condition: stable Instructions: Iron Rich Diet, PPD Discharge to: Home  Follow-up Information    Follow up with [facility] In 6 weeks.   Specialty:  Obstetrics and Gynecology   Why:  Please call if you have any questions or concerns prior to your next visit.   Contact information:   [address]  [facility] [place] [address] [phone]      Marlene Bast, MSN, CNM 07/03/2015 6:28 AM

## 2015-07-03 NOTE — Discharge Instructions (Signed)
Postpartum Depression and Baby Blues °The postpartum period begins right after the birth of a baby. During this time, there is often a great amount of joy and excitement. It is also a time of many changes in the life of the parents. Regardless of how many times a mother gives birth, each child brings new challenges and dynamics to the family. It is not unusual to have feelings of excitement along with confusing shifts in moods, emotions, and thoughts. All mothers are at risk of developing postpartum depression or the "baby blues." These mood changes can occur right after giving birth, or they may occur many months after giving birth. The baby blues or postpartum depression can be mild or severe. Additionally, postpartum depression can go away rather quickly, or it can be a long-term condition.  °CAUSES °Raised hormone levels and the rapid drop in those levels are thought to be a main cause of postpartum depression and the baby blues. A number of hormones change during and after pregnancy. Estrogen and progesterone usually decrease right after the delivery of your baby. The levels of thyroid hormone and various cortisol steroids also rapidly drop. Other factors that play a role in these mood changes include major life events and genetics.  °RISK FACTORS °If you have any of the following risks for the baby blues or postpartum depression, know what symptoms to watch out for during the postpartum period. Risk factors that may increase the likelihood of getting the baby blues or postpartum depression include: °· Having a personal or family history of depression.   °· Having depression while being pregnant.   °· Having premenstrual mood issues or mood issues related to oral contraceptives. °· Having a lot of life stress.   °· Having marital conflict.   °· Lacking a social support network.   °· Having a baby with special needs.   °· Having health problems, such as diabetes.   °SIGNS AND SYMPTOMS °Symptoms of baby blues  include: °· Brief changes in mood, such as going from extreme happiness to sadness. °· Decreased concentration.   °· Difficulty sleeping.   °· Crying spells, tearfulness.   °· Irritability.   °· Anxiety.   °Symptoms of postpartum depression typically begin within the first month after giving birth. These symptoms include: °· Difficulty sleeping or excessive sleepiness.   °· Marked weight loss.   °· Agitation.   °· Feelings of worthlessness.   °· Lack of interest in activity or food.   °Postpartum psychosis is a very serious condition and can be dangerous. Fortunately, it is rare. Displaying any of the following symptoms is cause for immediate medical attention. Symptoms of postpartum psychosis include:  °· Hallucinations and delusions.   °· Bizarre or disorganized behavior.   °· Confusion or disorientation.   °DIAGNOSIS  °A diagnosis is made by an evaluation of your symptoms. There are no medical or lab tests that lead to a diagnosis, but there are various questionnaires that a health care provider may use to identify those with the baby blues, postpartum depression, or psychosis. Often, a screening tool called the Edinburgh Postnatal Depression Scale is used to diagnose depression in the postpartum period.  °TREATMENT °The baby blues usually goes away on its own in 1-2 weeks. Social support is often all that is needed. You will be encouraged to get adequate sleep and rest. Occasionally, you may be given medicines to help you sleep.  °Postpartum depression requires treatment because it can last several months or longer if it is not treated. Treatment may include individual or group therapy, medicine, or both to address any social, physiological, and psychological   factors that may play a role in the depression. Regular exercise, a healthy diet, rest, and social support may also be strongly recommended.  °Postpartum psychosis is more serious and needs treatment right away. Hospitalization is often needed. °HOME CARE  INSTRUCTIONS °· Get as much rest as you can. Nap when the baby sleeps.   °· Exercise regularly. Some women find yoga and walking to be beneficial.   °· Eat a balanced and nourishing diet.   °· Do little things that you enjoy. Have a cup of tea, take a bubble bath, read your favorite magazine, or listen to your favorite music. °· Avoid alcohol.   °· Ask for help with household chores, cooking, grocery shopping, or running errands as needed. Do not try to do everything.   °· Talk to people close to you about how you are feeling. Get support from your partner, family members, friends, or other new moms. °· Try to stay positive in how you think. Think about the things you are grateful for.   °· Do not spend a lot of time alone.   °· Only take over-the-counter or prescription medicine as directed by your health care provider. °· Keep all your postpartum appointments.   °· Let your health care provider know if you have any concerns.   °SEEK MEDICAL CARE IF: °You are having a reaction to or problems with your medicine. °SEEK IMMEDIATE MEDICAL CARE IF: °· You have suicidal feelings.   °· You think you may harm the baby or someone else. °MAKE SURE YOU: °· Understand these instructions. °· Will watch your condition. °· Will get help right away if you are not doing well or get worse. °Document Released: 09/02/2004 Document Revised: 12/04/2013 Document Reviewed: 09/10/2013 °ExitCare® Patient Information ©2015 [employer]. This information is not intended to replace advice given to you by your health care provider. Make sure you discuss any questions you have with your health care provider. °Iron-Rich Diet °An iron-rich diet contains foods that are good sources of iron. Iron is an important mineral that helps your body produce hemoglobin. Hemoglobin is a protein in red blood cells that carries oxygen to the body's tissues. Sometimes, the iron level in your blood can be low. This may be caused by: °· A lack of iron in your  diet. °· Blood loss. °· Times of growth, such as during pregnancy or during a child's growth and development. °Low levels of iron can cause a decrease in the number of red blood cells. This can result in iron deficiency anemia. Iron deficiency anemia symptoms include: °· Tiredness. °· Weakness. °· Irritability. °· Increased chance of infection. °Here are some recommendations for daily iron intake: °· Males older than 33 years of age need 8 mg of iron per day. °· Women ages 19 to 50 need 18 mg of iron per day. °· Pregnant women need 27 mg of iron per day, and women who are over 19 years of age and breastfeeding need 9 mg of iron per day. °· Women over the age of 50 need 8 mg of iron per day. °SOURCES OF IRON °There are 2 types of iron that are found in food: heme iron and nonheme iron. Heme iron is absorbed by the body better than nonheme iron. Heme iron is found in meat, poultry, and fish. Nonheme iron is found in grains, beans, and vegetables. °Heme Iron Sources °Food / Iron (mg) °· Chicken liver, 3 oz (85 g)/ 10 mg °· Beef liver, 3 oz (85 g)/ 5.5 mg °· Oysters, 3 oz (85 g)/ 8   mg °· Beef, 3 oz (85 g)/ 2 to 3 mg °· Shrimp, 3 oz (85 g)/ 2.8 mg °· Turkey, 3 oz (85 g)/ 2 mg °· Chicken, 3 oz (85 g) / 1 mg °· Fish (tuna, halibut), 3 oz (85 g)/ 1 mg °· Pork, 3 oz (85 g)/ 0.9 mg °Nonheme Iron Sources °Food / Iron (mg) °· Ready-to-eat breakfast cereal, iron-fortified / 3.9 to 7 mg °· Tofu, ½ cup / 3.4 mg °· Kidney beans, ½ cup / 2.6 mg °· Baked potato with skin / 2.7 mg °· Asparagus, ½ cup / 2.2 mg °· Avocado / 2 mg °· Dried peaches, ½ cup / 1.6 mg °· Raisins, ½ cup / 1.5 mg °· Soy milk, 1 cup / 1.5 mg °· Whole-wheat bread, 1 slice / 1.2 mg °· Spinach, 1 cup / 0.8 mg °· Broccoli, ½ cup / 0.6 mg °IRON ABSORPTION °Certain foods can decrease the body's absorption of iron. Try to avoid these foods and beverages while eating meals with iron-containing foods: °· Coffee. °· Tea. °· Fiber. °· Soy. °Foods containing vitamin C can  help increase the amount of iron your body absorbs from iron sources, especially from nonheme sources. Eat foods with vitamin C along with iron-containing foods to increase your iron absorption. Foods that are high in vitamin C include many fruits and vegetables. Some good sources are: °· Fresh orange juice. °· Oranges. °· Strawberries. °· Mangoes. °· Grapefruit. °· Red bell peppers. °· Green bell peppers. °· Broccoli. °· Potatoes with skin. °· Tomato juice. °Document Released: 07/13/2005 Document Revised: 02/21/2012 Document Reviewed: 05/20/2011 °ExitCare® Patient Information ©2015 [employer]. This information is not intended to replace advice given to you by your health care provider. Make sure you discuss any questions you have with your health care provider. ° °

## 2020-04-05 ENCOUNTER — Other Ambulatory Visit: Payer: Self-pay

## 2020-04-05 ENCOUNTER — Emergency Department (HOSPITAL_COMMUNITY): Payer: No Typology Code available for payment source

## 2020-04-05 ENCOUNTER — Encounter (HOSPITAL_COMMUNITY): Payer: Self-pay | Admitting: Emergency Medicine

## 2020-04-05 ENCOUNTER — Emergency Department (HOSPITAL_COMMUNITY)
Admission: EM | Admit: 2020-04-05 | Discharge: 2020-04-05 | Disposition: A | Discharge status: Home / Self Care | Payer: No Typology Code available for payment source | Attending: Emergency Medicine | Admitting: Emergency Medicine

## 2020-04-05 ENCOUNTER — Ambulatory Visit (HOSPITAL_COMMUNITY)
Admission: EM | Admit: 2020-04-05 | Discharge: 2020-04-05 | Disposition: A | Discharge status: Home / Self Care | Payer: Self-pay

## 2020-04-05 DIAGNOSIS — H538 Other visual disturbances: Secondary | ICD-10-CM | POA: Diagnosis not present

## 2020-04-05 DIAGNOSIS — Z79899 Other long term (current) drug therapy: Secondary | ICD-10-CM | POA: Insufficient documentation

## 2020-04-05 DIAGNOSIS — R519 Headache, unspecified: Secondary | ICD-10-CM | POA: Diagnosis present

## 2020-04-05 DIAGNOSIS — M542 Cervicalgia: Secondary | ICD-10-CM | POA: Diagnosis not present

## 2020-04-05 MED ORDER — CYCLOBENZAPRINE HCL 10 MG PO TABS
10.0000 mg | ORAL_TABLET | Freq: Two times a day (BID) | ORAL | 0 refills | Status: AC | PRN
Start: 2020-04-05 — End: ?
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
## 2020-04-05 NOTE — ED Notes (Signed)
Pt transported to CT ?
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
## 2023-05-27 NOTE — Progress Notes (Deleted)
CARDIOLOGY CONSULT NOTE       Patient ID: Melanie Haynes MRN: [id number] DOB/AGE: Aug 04, 1981 41 y.o.  Referring Physician: Lindaann Pascal PA c  Primary Physician: Patient, No Pcp Per Primary Cardiologist: New Reason for Consultation: Chest Pain  Active Problems:   * No active hospital problems. *   HPI:  43 y.o. referred by Lindaann Pascal PA for chest pain and dyspnea. Seen in his office 05/10/23 Intermittent for last 6-7 months Random central chest tightness for 10 minutes with dyspnea.  Some radiation to left arm Resolves spontaneously.  Most episodes late at night Paternal grandmother and uncle with unexpected sudden death ECG in office normal QT 378   ***  ROS All other systems reviewed and negative except as noted above  Past Medical History:  Diagnosis Date   Chest pain    Chest tightness    Family history of diabetes mellitus (DM)    Medical history non-contributory     No family history on file.  Social History   Socioeconomic History   Marital status: Single    Spouse name: Not on file   Number of children: Not on file   Years of education: Not on file   Highest education level: Not on file  Occupational History   Not on file  Tobacco Use   Smoking status: Never   Smokeless tobacco: Never  Substance and Sexual Activity   Alcohol use: No    Alcohol/week: 0.0 standard drinks of alcohol   Drug use: No   Sexual activity: Yes  Other Topics Concern   Not on file  Social History Narrative   Not on file   Social Determinants of Health   Financial Resource Strain: Not on file  Food Insecurity: Not on file  Transportation Needs: Not on file  Physical Activity: Not on file  Stress: Not on file  Social Connections: Not on file  Intimate Partner Violence: Not on file    Past Surgical History:  Procedure Laterality Date   CESAREAN SECTION     CESAREAN SECTION WITH BILATERAL TUBAL LIGATION Bilateral 06/30/2015   Procedure: CESAREAN SECTION WITH BILATERAL TUBAL  LIGATION;  Surgeon: Silverio Lay, MD;  Location: [facility];  Service: Obstetrics;  Laterality: Bilateral;      Current Outpatient Medications:    cyclobenzaprine (FLEXERIL) 10 MG tablet, Take 1 tablet (10 mg total) by mouth 2 (two) times daily as needed for muscle spasms., Disp: 20 tablet, Rfl: 0   HYDROcodone-acetaminophen (NORCO/VICODIN) 5-325 MG per tablet, Take 1-2 tablets by mouth every 6 (six) hours as needed for moderate pain or severe pain., Disp: 30 tablet, Rfl: 0   ibuprofen (ADVIL,MOTRIN) 600 MG tablet, Take 1 tablet (600 mg total) by mouth every 6 (six) hours., Disp: 30 tablet, Rfl: 2   Prenatal Vit-Fe Fumarate-FA (PRENATAL MULTIVITAMIN) TABS tablet, Take 1 tablet by mouth daily at 12 noon. (Patient taking differently: Take 1 tablet by mouth daily at 12 noon. Pt states that she takes a prenatal vitamin with a regular vitamin), Disp: 30 tablet, Rfl: 7    Physical Exam: unknown if currently breastfeeding.    Affect appropriate Healthy:  appears stated age HEENT: normal Neck supple with no adenopathy JVP normal no bruits no thyromegaly Lungs clear with no wheezing and good diaphragmatic motion Heart:  S1/S2 no murmur, no rub, gallop or click PMI normal Abdomen: benighn, BS positve, no tenderness, no AAA no bruit.  No HSM or HJR Distal pulses intact with no bruits No edema Neuro  non-focal Skin warm and dry No muscular weakness   Labs:   Lab Results  Component Value Date   WBC 7.2 07/01/2015   HGB 9.2 (L) 07/01/2015   HCT 28.6 (L) 07/01/2015   MCV 81.5 07/01/2015   PLT 187 07/01/2015   No results for input(s): "NA", "K", "CL", "CO2", "BUN", "CREATININE", "CALCIUM", "PROT", "BILITOT", "ALKPHOS", "ALT", "AST", "GLUCOSE" in the last 168 hours.  Invalid input(s): "LABALBU" No results found for: "CKTOTAL", "CKMB", "CKMBINDEX", "TROPONINI" No results found for: "CHOL" No results found for: "HDL" No results found for: "LDLCALC" No results found for: "TRIG" No results  found for: "CHOLHDL" No results found for: "LDLDIRECT"    Radiology: No results found.  EKG: ***   ASSESSMENT AND PLAN:   Atypical chest Pain:  normal ECG shared decision making favor gated cardiac CTA. Can assess lung fields during exam Lopressor 100 mg 2 hours prior to test No contrast allergy BMET today Dyspnea:  see above TTE to r/o structural heart dx Family History Unexpected Sudden Death:  ECG benign normal QT TTE to r/o structural heart dx No syncope see above regarding CTA for CAD  Cardiac CTA TTE  F/U PRN pending test results   Signed: Charlton Haws 05/27/2023, 1:53 PM

## 2023-06-09 ENCOUNTER — Ambulatory Visit: Payer: No Typology Code available for payment source | Admitting: Cardiovascular Disease
